# Patient Record
Sex: Female | Born: 1985 | Race: Black or African American | Hispanic: No | Marital: Single | Smoking: Never smoker
Health system: Southern US, Community
[De-identification: ages and names within clinical notes are randomized; demographics above are authoritative.]

## PROBLEM LIST (undated history)

## (undated) ENCOUNTER — Inpatient Hospital Stay (HOSPITAL_COMMUNITY): Payer: Self-pay

## (undated) DIAGNOSIS — Z973 Presence of spectacles and contact lenses: Secondary | ICD-10-CM

## (undated) DIAGNOSIS — Z87442 Personal history of urinary calculi: Secondary | ICD-10-CM

## (undated) DIAGNOSIS — R87629 Unspecified abnormal cytological findings in specimens from vagina: Secondary | ICD-10-CM

## (undated) DIAGNOSIS — D069 Carcinoma in situ of cervix, unspecified: Secondary | ICD-10-CM

## (undated) HISTORY — PX: COLPOSCOPY: SHX161

---
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
## 2003-03-16 ENCOUNTER — Encounter: Payer: Self-pay | Admitting: Emergency Medicine

## 2003-03-16 ENCOUNTER — Emergency Department (HOSPITAL_COMMUNITY): Admission: EM | Admit: 2003-03-16 | Discharge: 2003-03-16 | Payer: Self-pay | Admitting: Emergency Medicine

## 2003-05-28 ENCOUNTER — Emergency Department (HOSPITAL_COMMUNITY): Admission: EM | Admit: 2003-05-28 | Discharge: 2003-05-28 | Payer: Self-pay | Admitting: Emergency Medicine

## 2003-06-11 ENCOUNTER — Emergency Department (HOSPITAL_COMMUNITY): Admission: EM | Admit: 2003-06-11 | Discharge: 2003-06-11 | Payer: Self-pay | Admitting: Emergency Medicine

## 2003-11-11 ENCOUNTER — Emergency Department (HOSPITAL_COMMUNITY): Admission: EM | Admit: 2003-11-11 | Discharge: 2003-11-11 | Payer: Self-pay | Admitting: Emergency Medicine

## 2004-02-23 ENCOUNTER — Emergency Department (HOSPITAL_COMMUNITY): Admission: EM | Admit: 2004-02-23 | Discharge: 2004-02-24 | Payer: Self-pay | Admitting: Emergency Medicine

## 2004-02-27 ENCOUNTER — Emergency Department (HOSPITAL_COMMUNITY): Admission: EM | Admit: 2004-02-27 | Discharge: 2004-02-28 | Payer: Self-pay | Admitting: Emergency Medicine

## 2004-03-19 ENCOUNTER — Emergency Department (HOSPITAL_COMMUNITY): Admission: AD | Admit: 2004-03-19 | Discharge: 2004-03-19 | Payer: Self-pay | Admitting: Family Medicine

## 2004-05-28 ENCOUNTER — Emergency Department (HOSPITAL_COMMUNITY): Admission: EM | Admit: 2004-05-28 | Discharge: 2004-05-28 | Payer: Self-pay | Admitting: Emergency Medicine

## 2004-06-08 ENCOUNTER — Emergency Department (HOSPITAL_COMMUNITY): Admission: EM | Admit: 2004-06-08 | Discharge: 2004-06-08 | Payer: Self-pay | Admitting: Emergency Medicine

## 2004-07-11 ENCOUNTER — Emergency Department (HOSPITAL_COMMUNITY): Admission: EM | Admit: 2004-07-11 | Discharge: 2004-07-11 | Payer: Self-pay | Admitting: *Deleted

## 2004-08-01 ENCOUNTER — Emergency Department (HOSPITAL_COMMUNITY): Admission: EM | Admit: 2004-08-01 | Discharge: 2004-08-01 | Payer: Self-pay | Admitting: Family Medicine

## 2004-10-23 ENCOUNTER — Emergency Department (HOSPITAL_COMMUNITY): Admission: EM | Admit: 2004-10-23 | Discharge: 2004-10-23 | Payer: Self-pay | Admitting: *Deleted

## 2005-01-17 ENCOUNTER — Emergency Department (HOSPITAL_COMMUNITY): Admission: EM | Admit: 2005-01-17 | Discharge: 2005-01-17 | Payer: Self-pay | Admitting: Family Medicine

## 2005-01-28 ENCOUNTER — Emergency Department (HOSPITAL_COMMUNITY): Admission: EM | Admit: 2005-01-28 | Discharge: 2005-01-28 | Payer: Self-pay | Admitting: Family Medicine

## 2005-02-25 ENCOUNTER — Emergency Department (HOSPITAL_COMMUNITY): Admission: EM | Admit: 2005-02-25 | Discharge: 2005-02-25 | Payer: Self-pay | Admitting: Emergency Medicine

## 2005-04-01 ENCOUNTER — Emergency Department (HOSPITAL_COMMUNITY): Admission: EM | Admit: 2005-04-01 | Discharge: 2005-04-01 | Payer: Self-pay | Admitting: Emergency Medicine

## 2005-04-12 ENCOUNTER — Ambulatory Visit: Payer: Self-pay | Admitting: Internal Medicine

## 2005-04-14 ENCOUNTER — Inpatient Hospital Stay (HOSPITAL_COMMUNITY): Admission: EM | Admit: 2005-04-14 | Discharge: 2005-04-16 | Payer: Self-pay | Admitting: Emergency Medicine

## 2005-05-18 ENCOUNTER — Emergency Department (HOSPITAL_COMMUNITY): Admission: EM | Admit: 2005-05-18 | Discharge: 2005-05-18 | Payer: Self-pay | Admitting: Family Medicine

## 2005-12-25 ENCOUNTER — Emergency Department (HOSPITAL_COMMUNITY): Admission: EM | Admit: 2005-12-25 | Discharge: 2005-12-25 | Payer: Self-pay | Admitting: Family Medicine

## 2006-05-08 ENCOUNTER — Emergency Department (HOSPITAL_COMMUNITY): Admission: EM | Admit: 2006-05-08 | Discharge: 2006-05-09 | Payer: Self-pay | Admitting: Emergency Medicine

## 2006-05-19 ENCOUNTER — Emergency Department (HOSPITAL_COMMUNITY): Admission: EM | Admit: 2006-05-19 | Discharge: 2006-05-19 | Payer: Self-pay | Admitting: Emergency Medicine

## 2006-06-14 ENCOUNTER — Emergency Department (HOSPITAL_COMMUNITY): Admission: EM | Admit: 2006-06-14 | Discharge: 2006-06-14 | Payer: Self-pay | Admitting: Emergency Medicine

## 2006-06-30 ENCOUNTER — Emergency Department (HOSPITAL_COMMUNITY): Admission: EM | Admit: 2006-06-30 | Discharge: 2006-06-30 | Payer: Self-pay | Admitting: Emergency Medicine

## 2006-07-01 ENCOUNTER — Emergency Department (HOSPITAL_COMMUNITY): Admission: EM | Admit: 2006-07-01 | Discharge: 2006-07-02 | Payer: Self-pay | Admitting: Emergency Medicine

## 2006-08-16 ENCOUNTER — Emergency Department (HOSPITAL_COMMUNITY): Admission: EM | Admit: 2006-08-16 | Discharge: 2006-08-16 | Payer: Self-pay | Admitting: Emergency Medicine

## 2007-01-06 ENCOUNTER — Emergency Department (HOSPITAL_COMMUNITY): Admission: EM | Admit: 2007-01-06 | Discharge: 2007-01-06 | Payer: Self-pay | Admitting: Emergency Medicine

## 2007-03-27 ENCOUNTER — Emergency Department (HOSPITAL_COMMUNITY): Admission: EM | Admit: 2007-03-27 | Discharge: 2007-03-27 | Payer: Self-pay | Admitting: Emergency Medicine

## 2007-05-25 ENCOUNTER — Emergency Department (HOSPITAL_COMMUNITY): Admission: EM | Admit: 2007-05-25 | Discharge: 2007-05-25 | Payer: Self-pay | Admitting: Emergency Medicine

## 2007-06-24 ENCOUNTER — Emergency Department (HOSPITAL_COMMUNITY): Admission: EM | Admit: 2007-06-24 | Discharge: 2007-06-24 | Payer: Self-pay | Admitting: Emergency Medicine

## 2007-07-28 ENCOUNTER — Emergency Department (HOSPITAL_COMMUNITY): Admission: EM | Admit: 2007-07-28 | Discharge: 2007-07-28 | Payer: Self-pay | Admitting: Emergency Medicine

## 2007-08-03 ENCOUNTER — Emergency Department (HOSPITAL_COMMUNITY): Admission: EM | Admit: 2007-08-03 | Discharge: 2007-08-03 | Payer: Self-pay | Admitting: Emergency Medicine

## 2008-02-26 ENCOUNTER — Emergency Department (HOSPITAL_COMMUNITY): Admission: EM | Admit: 2008-02-26 | Discharge: 2008-02-26 | Payer: Self-pay | Admitting: Family Medicine

## 2008-08-31 ENCOUNTER — Emergency Department (HOSPITAL_COMMUNITY): Admission: EM | Admit: 2008-08-31 | Discharge: 2008-08-31 | Payer: Self-pay | Admitting: Family Medicine

## 2008-12-04 ENCOUNTER — Emergency Department (HOSPITAL_COMMUNITY): Admission: EM | Admit: 2008-12-04 | Discharge: 2008-12-04 | Payer: Self-pay | Admitting: Family Medicine

## 2009-02-12 ENCOUNTER — Emergency Department (HOSPITAL_COMMUNITY): Admission: EM | Admit: 2009-02-12 | Discharge: 2009-02-12 | Payer: Self-pay | Admitting: Emergency Medicine

## 2009-02-14 ENCOUNTER — Emergency Department (HOSPITAL_COMMUNITY): Admission: EM | Admit: 2009-02-14 | Discharge: 2009-02-14 | Payer: Self-pay | Admitting: Family Medicine

## 2010-03-13 ENCOUNTER — Emergency Department (HOSPITAL_COMMUNITY): Admission: EM | Admit: 2010-03-13 | Discharge: 2010-03-13 | Payer: Self-pay | Admitting: Emergency Medicine

## 2010-08-14 ENCOUNTER — Emergency Department (HOSPITAL_COMMUNITY): Admission: EM | Admit: 2010-08-14 | Discharge: 2010-08-14 | Payer: Self-pay | Admitting: Family Medicine

## 2010-11-09 ENCOUNTER — Emergency Department (HOSPITAL_COMMUNITY): Admission: EM | Admit: 2010-11-09 | Discharge: 2010-11-09 | Payer: Self-pay | Admitting: Emergency Medicine

## 2010-12-30 HISTORY — PX: WISDOM TOOTH EXTRACTION: SHX21

## 2011-03-12 LAB — URINALYSIS, ROUTINE W REFLEX MICROSCOPIC
Bilirubin Urine: NEGATIVE
Glucose, UA: NEGATIVE mg/dL
Hgb urine dipstick: NEGATIVE
Ketones, ur: NEGATIVE mg/dL
Nitrite: NEGATIVE
Protein, ur: NEGATIVE mg/dL
Specific Gravity, Urine: 1.024 (ref 1.005–1.030)
Urobilinogen, UA: 1 mg/dL (ref 0.0–1.0)
pH: 7 (ref 5.0–8.0)

## 2011-03-12 LAB — WET PREP, GENITAL
Clue Cells Wet Prep HPF POC: NONE SEEN
Trich, Wet Prep: NONE SEEN
Yeast Wet Prep HPF POC: NONE SEEN

## 2011-03-12 LAB — POCT PREGNANCY, URINE: Preg Test, Ur: NEGATIVE

## 2011-03-12 LAB — GC/CHLAMYDIA PROBE AMP, GENITAL
Chlamydia, DNA Probe: NEGATIVE
GC Probe Amp, Genital: NEGATIVE

## 2011-03-22 LAB — CBC
HCT: 36.4 % (ref 36.0–46.0)
Hemoglobin: 11.9 g/dL — ABNORMAL LOW (ref 12.0–15.0)
MCHC: 32.7 g/dL (ref 30.0–36.0)
MCV: 91 fL (ref 78.0–100.0)
Platelets: 283 10*3/uL (ref 150–400)
RBC: 4 MIL/uL (ref 3.87–5.11)
RDW: 13.2 % (ref 11.5–15.5)
WBC: 10.2 10*3/uL (ref 4.0–10.5)

## 2011-03-22 LAB — DIFFERENTIAL
Basophils Absolute: 0 10*3/uL (ref 0.0–0.1)
Basophils Relative: 0 % (ref 0–1)
Eosinophils Absolute: 0.2 10*3/uL (ref 0.0–0.7)
Eosinophils Relative: 2 % (ref 0–5)
Lymphocytes Relative: 32 % (ref 12–46)
Lymphs Abs: 3.3 10*3/uL (ref 0.7–4.0)
Monocytes Absolute: 0.8 10*3/uL (ref 0.1–1.0)
Monocytes Relative: 8 % (ref 3–12)
Neutro Abs: 5.9 10*3/uL (ref 1.7–7.7)
Neutrophils Relative %: 57 % (ref 43–77)

## 2011-03-22 LAB — POCT I-STAT, CHEM 8
BUN: 8 mg/dL (ref 6–23)
Calcium, Ion: 1.15 mmol/L (ref 1.12–1.32)
Chloride: 106 mEq/L (ref 96–112)
Creatinine, Ser: 0.6 mg/dL (ref 0.4–1.2)
Glucose, Bld: 100 mg/dL — ABNORMAL HIGH (ref 70–99)
HCT: 37 % (ref 36.0–46.0)
Hemoglobin: 12.6 g/dL (ref 12.0–15.0)
Potassium: 3.8 mEq/L (ref 3.5–5.1)
Sodium: 139 mEq/L (ref 135–145)
TCO2: 26 mmol/L (ref 0–100)

## 2011-03-22 LAB — POCT PREGNANCY, URINE: Preg Test, Ur: NEGATIVE

## 2011-04-16 LAB — URINE MICROSCOPIC-ADD ON

## 2011-04-16 LAB — URINALYSIS, ROUTINE W REFLEX MICROSCOPIC
Bilirubin Urine: NEGATIVE
Glucose, UA: NEGATIVE mg/dL
Ketones, ur: 15 mg/dL — AB
Nitrite: NEGATIVE
Protein, ur: NEGATIVE mg/dL
Specific Gravity, Urine: 1.03 (ref 1.005–1.030)
Urobilinogen, UA: 1 mg/dL (ref 0.0–1.0)
pH: 6.5 (ref 5.0–8.0)

## 2011-04-16 LAB — POCT URINALYSIS DIP (DEVICE)
Bilirubin Urine: NEGATIVE
Glucose, UA: NEGATIVE mg/dL
Ketones, ur: NEGATIVE mg/dL
Nitrite: NEGATIVE
Protein, ur: NEGATIVE mg/dL
Specific Gravity, Urine: 1.02 (ref 1.005–1.030)
Urobilinogen, UA: 2 mg/dL — ABNORMAL HIGH (ref 0.0–1.0)
pH: 7 (ref 5.0–8.0)

## 2011-04-16 LAB — URINE CULTURE: Colony Count: >=100000

## 2011-04-16 LAB — POCT PREGNANCY, URINE: Preg Test, Ur: NEGATIVE

## 2011-04-23 ENCOUNTER — Inpatient Hospital Stay (INDEPENDENT_AMBULATORY_CARE_PROVIDER_SITE_OTHER)
Admission: RE | Admit: 2011-04-23 | Discharge: 2011-04-23 | Disposition: A | Discharge status: Home / Self Care | Payer: Managed Care, Other (non HMO) | Source: Ambulatory Visit | Attending: Family Medicine | Admitting: Family Medicine

## 2011-04-23 DIAGNOSIS — R509 Fever, unspecified: Secondary | ICD-10-CM

## 2011-04-23 DIAGNOSIS — J020 Streptococcal pharyngitis: Secondary | ICD-10-CM

## 2011-04-23 LAB — POCT RAPID STREP A (OFFICE): Streptococcus, Group A Screen (Direct): POSITIVE — AB

## 2011-05-17 NOTE — Discharge Summary (Signed)
   NAME:  [name], [name]                        ACCOUNT NO.:  [id number]   MEDICAL RECORD NO.:  [id number]                   PATIENT TYPE:  INP   LOCATION:  9125                                 FACILITY:  [facility]   PHYSICIAN:  Roseanna Rainbow, M.D.         DATE OF BIRTH:  Jan 29, 1986   DATE OF ADMISSION:  12/23/2002  DATE OF DISCHARGE:  12/26/2002                                 DISCHARGE SUMMARY   CHIEF COMPLAINT:  The patient is an 25 year old G1 with an EDC of December  17 who presented with regular strong contractions.   HISTORY OF PRESENT ILLNESS:  She denied ruptured membranes.  Her prenatal  course was uncomplicated.   PAST MEDICAL HISTORY:  She denies.   PAST SURGICAL HISTORY:  She denies.   MEDICATIONS:  Prenatal vitamins.   ALLERGIES:  No known drug allergies.   SOCIAL HISTORY:  Single.  She denies any tobacco, ethanol, or substance  abuse.   PHYSICAL EXAMINATION:  VITAL SIGNS:  Stable, afebrile.  ABDOMEN:  Soft, gravid, nontender.  PELVIC:  Cervix 6 cm dilated, 100% effaced with vertex at a -1 station.  During the examination artificial rupture of membranes was performed with  clear fluid.   LABORATORIES:  Hemoglobin 10.   ASSESSMENT:  Intrauterine pregnancy at 40+ weeks, active labor.   PLAN:  Admission with expectant management.   HOSPITAL COURSE:  The patient was subsequently admitted and augmented with  Pitocin for a protracted active phase of labor.  She progressed to fully  dilated with persistent occiput posterior and arrest of descent at a +1 to  +2 station.  At this point she was brought for a cesarean delivery.  Please  see the dictated operative summary for further details.  Postoperative  course was uneventful and she was discharged to home on postoperative day  number three tolerating a regular diet.   DISCHARGE DIAGNOSES:  1. Term intrauterine pregnancy.  2. Arrest of descent.  3. Persistent occiput posterior.   PROCEDURE:  Primary  cesarean delivery.   CONDITION ON DISCHARGE:  Good.   DIET:  Regular.   ACTIVITY:  No strenuous activity.  No intercourse.   MEDICATIONS:  1. Percocet one to two tablets q.4-6h. as needed.  2. Micronor one tablet p.o. daily second Sunday start.   DISPOSITION:  The patient was to have follow-up in the office for her six-  week postpartum visit.                                               Roseanna Rainbow, M.D.    [name]  D:  02/10/2003  T:  02/10/2003  Job:  [id number]

## 2011-05-17 NOTE — Discharge Summary (Signed)
[name], [name]              ACCOUNT NO.:  [id number]   MEDICAL RECORD NO.:  [id number]          PATIENT TYPE:  INP   LOCATION:  0362                         FACILITY:  [facility]   PHYSICIAN:  Gertha Calkin, M.D.DATE OF BIRTH:  [birth year]   DATE OF ADMISSION:  04/14/2005  DATE OF DISCHARGE:  04/16/2005                                 DISCHARGE SUMMARY   PRIMARY CARE PHYSICIAN:  Unassigned.   CONSULTS:  Dr. Juanda Chance, GI.   DISCHARGE DIAGNOSES:  1.  Gastritis secondary to retching episodes.  2.  Gastroenteritis.   DISCHARGE MEDICATIONS:  Protonix 40 mg p.o. daily.   SPECIAL INSTRUCTIONS:  Avoid aspirin-related products for at least a month.   HOSPITAL COURSE:  Please see H&P for details of admission.   Problem 1:  Gastritis:  The patient came in with an episode of hematemesis  after a severe retching episode.  The patient was seen by GI on admission  and had an EGD done.  Please see GI consultation and EGD report for details.  Essentially, the patient had some erosions noted in the cardia and fundus of  the stomach.  The patient had no other active bleeding noted during the EGD.  The patient resolved her symptoms of nausea and vomiting with no diarrhea  during her hospitalization.  She is now tolerating p.o. diet.  No epigastric  pain or discomfort on the day of discharge.   Problem 2:  Gastroenteritis:  This was supported during the hospitalization.  The patient is being discharged home in stable condition without any nausea,  vomiting, or diarrhea.   DISCHARGE LABS:  Sodium 138, potassium 3.4, chloride 107, bicarb 28, glucose  88, BUN 4, creatinine 0.7, calcium 8.2.  C. diff toxin was negative.  CBC  showed hemoglobin 10.7, white count 7.8, platelets 285.  Stool leukocytes  were negative.   Patient was initially started on Cipro IV for empiric treatment, possible  Salmonella infection, and will continue empiric treatment for a full course.     JD/MEDQ  D:   04/16/2005  T:  04/16/2005  Job:  [id number]   cc:   Lina Sar, M.D. [facility]

## 2011-05-17 NOTE — Discharge Summary (Signed)
NAMEGEORJEAN, [name]                        ACCOUNT NO.:  [id number]   MEDICAL RECORD NO.:  [id number]                   PATIENT TYPE:  INP   LOCATION:  9141                                 [facility]ACILITY[facility]  [facility]   PHYSICIAN:  Charles A. Clearance Coots, M.D.             DATE OF BIRTH:  1986-06-30   DATE OF ADMISSION:  10/24/2002  DATE OF DISCHARGE:  10/28/2002                                 DISCHARGE SUMMARY   ADMITTING DIAGNOSES:  1. A 32 week intrauterine pregnancy.  2. Pyelonephritis.   DISCHARGE DIAGNOSES:  1. A 32 week intrauterine pregnancy.  2. Pyelonephritis improved after intravenous antibiotic therapy.   DISPOSITION:  Discharged home in good condition.   REASON FOR ADMISSION:  An 25 year old black female G1, estimated date of  confinement of December 15, 2002 admitted to [facility] at [redacted] weeks  gestation with a complaint of two days of left flank pain and fever and one  day of severe diffuse muscle aches.  The patient is status post treatment  for a urinary tract infection for one week.  She had distant history of  uronephrolithiasis, recent exposure to sick child.  Denies upper respiratory  tract symptoms, nausea, vomiting, diarrhea, or uterine contractions.  She  had good fetal movement.  Prenatal care was initially done at [facility]  at [facility] and she was transferred to the [facility], care of Dr. Antionette Char.  Her onset of prenatal care  was at [redacted] weeks gestation.   PAST SURGICAL HISTORY:  None.   PAST MEDICAL HISTORY:  Illnesses:  Heart murmur.   MEDICATIONS:  Iron, prenatal vitamins.   ALLERGIES:  No known drug allergies.   SOCIAL HISTORY:  Negative for tobacco, alcohol, or recreational drugs.   GYN HISTORY:  Negative for sexually transmitted diseases.   PHYSICAL EXAMINATION:  GENERAL:  Well-nourished, well-developed black female  in no acute distress.  VITAL SIGNS:  She was afebrile and vital signs were  stable.  Fetal heart  tones revealed baseline fetal tachycardia which settled down to 140-150  beats per minute after hydration.  There were no fetal heart rate  decelerations and the fetal heart rate tracing was reassuring after  hydration.  ABDOMEN:  Soft, nontender.  PELVIC:  Cervix is long, closed.  Presenting part was high.   IMPRESSION:  1. Intrauterine pregnancy at [redacted] weeks gestation.  2. Probable pyelonephritis.   PLAN:  Admit for IV fluid hydration and continuation of antibiotic therapy  intravenously to cover pyelonephritis.   ADMITTING LABORATORY VALUES:  Hemoglobin 10.0, hematocrit 30.2, white blood  cell count 15,200, platelets 254,000.  Electrolytes were within normal  limits.  Comprehensive metabolic panel was within normal limits.  Urinalysis  revealed moderate leukocyte esterase, trace hemoglobin, few bacteria, 7-10  white blood cells, 0-2 red blood cells, greater than 80 ketones, pH 7.0,  specific gravity 1.020.   HOSPITAL COURSE:  The  patient was admitted and started on IV Cefotan.  Responded well to IV therapy and by hospital day number two was feeling much  better.  Discharged home much improved on hospital day number three.   DISCHARGE MEDICATIONS:  1. Continue p.o. antibiotics with Ceftin 500 mg b.i.d. for seven days to     complete a total of 10 days antibiotic therapy for pyelonephritis.  2. Continue prenatal vitamins and iron.   DISCHARGE DISPOSITION:  Routine obstetrical written instructions were given.  The patient is to follow up regular appointment with Dr. Tamela Oddi at  the [facility].                                               Charles A. Clearance Coots, M.D.    CAH/MEDQ  D:  10/28/2002  T:  10/28/2002  Job:  [id number]   cc:   Roseanna Rainbow, M.D.  [address]  [place]  [place] [address]  Fax: [phone]

## 2011-05-17 NOTE — H&P (Signed)
NAMEKRISTE, [name]              ACCOUNT NO.:  [id number]   MEDICAL RECORD NO.:  [id number]          PATIENT TYPE:  INP   LOCATION:  0102                         FACILITY:  [facility]   PHYSICIAN:  Danae Chen, M.D.DATE OF BIRTH:  21-Apr-1986   DATE OF ADMISSION:  04/14/2005  DATE OF DISCHARGE:                                HISTORY & PHYSICAL   The patient is an unassigned patient with no primary care physician.   CHIEF COMPLAINT:  Nausea, vomiting, diarrhea with hematemesis.   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  She started about 4 p.m. this afternoon and has  continued on until she got to emergency room.  This was witnessed in the  emergency room as well.  She had some pinkish, clear emesis, about 500-600  mL.  She reports that she has never had any bloody vomitus in the past.  She  does not use any blood thinners, no aspirin, no nonsteroidals, over-the-  counter ibuprofen, or other medications.  She does report that two days ago  that she had eaten some boiled eggs from an [place] egg hunt as well as some  cole slaw and potato salad that was left unrefrigerated.  She has had  chills, subjective fever as well.  No chest pain, no shortness of breath, no  numbness or tingling in her hands or feet.   PAST MEDICAL HISTORY:  Not significant.   PAST SURGICAL HISTORY:  Cesarean section.   FAMILY HISTORY:  She is unaware of any significant family medical problems.   SOCIAL HISTORY:  She does not drink.  She does not smoke.  She is enrolled  at [employer]. She has one child.  She has had a cesarean section.  No  other pregnancies and she lives with her child.   REVIEW OF SYSTEMS:  Per the HPI.   PHYSICAL EXAMINATION:  VITAL SIGNS:  Temperature 97.6, pulse 77,  respirations 20, blood pressure 115/83.  O2 saturation is 96% on room air.  GENERAL:  She is in some mild distress, but speaking complete sentences,  answers questions appropriately.  Sister is at bedside.  HEENT:  Pupils are equal and reactive.  She has no scleral pallor.  Mucous  membranes are moist.  Oropharynx is clear.  NECK:  Supple.  There is no  lymphadenopathy.  LUNGS:  Clear to auscultation bilaterally.  HEART:  Rate is regular.  No murmurs are appreciated.  ABDOMEN:  She has no right upper quadrant tenderness.  No periumbilical  tenderness.  She has some hypoactive bowel sounds.  No suprapubic  tenderness.  No costovertebral angle tenderness.  EXTREMITIES:  2+ femoral pulses, 2+ dorsalis pedis pulses.  Extremities are  warm.  She has no peripheral edema.  NEUROLOGIC:  No focal deficits that are noticed.   LABORATORY DATA:  Acute abdominal series showing no acute abnormalities.  Pertinent labs:  White count 21,000 with an absolute  neutrophil count of  19.6, hemoglobin 13.9.  Urine pregnancy test is negative.  Platelets 307,  potassium 3.6, sodium 136, glucose 129, BUN 9, creatinine 0.8.  Her liver  function tests are within normal limits with an AST of 25, ALT 19, alk phos  72, total bilirubin 0.7, albumin 4.3.   MEDICATIONS:  As noted, she is not on any medications.   ALLERGIES:  No drug allergies.   ASSESSMENT:  A 25 year old African American female with hematemesis, nausea  and vomiting, subjective chills and fever.  She also relates that two weeks  ago she was seen by Urgent Care physician and was diagnosed with a kidney  infection and was placed on Cipro twice a day for 14 days.  She took about  five days worth but was not able afford the rest of her medicine so went  about six days without taking any medicines and then two days ago resumed  twice a day Cipro.  She does not know exactly whether resumption of the  antibiotics correlate with her symptoms.  She thinks it  is more related to  when she ate the eggs.   At this time, given the fact that she is hemodynamically stable, with a  hemoglobin in at normal level, we will admit her for observation.  Keep her  NPO after midline for possible endoscopy in the morning.  We will place two  large bore IV's, type and cross two units packed red blood cells and monitor  her clinically.  There is a chance that she may have a Salmonella infection.  We will check a stool, white blood cell, fecal leukocyte count as well as a  Clostridium difficile toxin given that she has been on recent antibiotics.  We will treat empirically with IV Cipro until that time to cover for the  Salmonella infection and await the further lab results.  It may be that she  has suffered a small Mallory-Weiss tear from her emesis and so, therefore,  GI has been consulted and are aware of her situation and will follow up with  her in the morning or sooner if needed.      RLK/MEDQ  D:  04/15/2005  T:  04/15/2005  Job:  [id number]

## 2011-05-17 NOTE — Op Note (Signed)
[name], [name]                        ACCOUNT NO.:  [id number]   MEDICAL RECORD NO.:  [id number]                   PATIENT TYPE:  INP   LOCATION:  9125                                 FACILITY:  [facility]   PHYSICIAN:  Roseanna Rainbow, M.D.         DATE OF BIRTH:  1986-06-20   DATE OF PROCEDURE:  12/23/2002  DATE OF DISCHARGE:  12/26/2002                                 OPERATIVE REPORT   PREOPERATIVE DIAGNOSES:  1. Term intrauterine pregnancy.  2. Arrest of descent.  3. Persistent occiput posterior.   POSTOPERATIVE DIAGNOSES:  1. Term intrauterine pregnancy.  2. Arrest of descent.  3. Persistent occiput posterior.   PROCEDURE:  Primary low transverse cesarean section via Pfannenstiel.   SURGEON:  Roseanna Rainbow, M.D.   ANESTHESIA:  Epidural.   COMPLICATIONS:  None.   ESTIMATED BLOOD LOSS:  800 cc.   FLUIDS:  As per anesthesiology.   URINE OUTPUT:  As per anesthesiology.   INDICATIONS:  The patient is an 25 year old G1, P0 at term who presented in  latent labor.  Maximum dilatation fully dilated without descent beyond a  +1/+2 station.  The position of the fetal head was left occiput posterior.   FINDINGS:  Female infant in cephalic presentation.  Neonatology present at  delivery.  Weight 8 pounds 12 ounces.  Normal uterus.   PROCEDURE:  The patient was taken to the operating room where her epidural  anesthetic was found to be adequate.  She was then prepped and draped in the  usual sterile fashion in the dorsal supine position with a leftward tilt.  Pfannenstiel skin incision was made with a scalpel and carried through to  the underlying layer of fascia with the Bovie.  The fascia was then incised  in the midline and the incision extended laterally with the Mayo scissors.  The superior aspect of the fascial incision was then grasped with Kocher  clamps, elevated, and the underlying rectus muscles dissected off.  Attention was then turned to the  inferior aspect of the incision which was  manipulated in a similar fashion.  The rectus muscles were separated in the  midline and the parietoperitoneum tented up and entered sharply.  The  peritoneal incision was then extended superiorly and inferiorly with good  visualization of the bladder.  The bladder blade was then inserted and the  vesicouterine peritoneum identified, grasped with the pickups, and entered  sharply with the Metzenbaum scissors.  This incision was then extended  laterally and the bladder flap created digitally.  The bladder blade was  then reinserted and the lower uterine segment incised in a transverse  fashion with the scalpel.  The uterine incision was then extended laterally  with bandage scissors.  The bladder blade was then removed and the infant's  head delivered atraumatically.  The nose and mouth were suctioned with the  bulb suction and the cord was clamped and cut.  The infant  was handed off to  the awaiting neonatologists.  The placenta was then removed, the uterus  cleared of all clots and debris.  The uterine incision was then repaired  with 0 Monocryl in a running locked fashion.  Second layer of the same was  used to imbricate the middle aspect of the incision.  Excellent hemostasis  was noted.  The gutters were cleared of all clots.  The fascia was  reapproximated with 0 Vicryl in a running fashion.  The skin was closed with  staples.  The patient tolerated the procedure well.  Sponge, lap, and needle  counts were correct x2.  1 g of cefazolin was given at cord clamp.  The  patient is taken to the PACU in stable condition.                                               Roseanna Rainbow, M.D.    Judee Clara  D:  12/27/2002  T:  12/27/2002  Job:  [id number]

## 2011-06-26 ENCOUNTER — Inpatient Hospital Stay (INDEPENDENT_AMBULATORY_CARE_PROVIDER_SITE_OTHER)
Admission: RE | Admit: 2011-06-26 | Discharge: 2011-06-26 | Disposition: A | Discharge status: Home / Self Care | Payer: Managed Care, Other (non HMO) | Source: Ambulatory Visit | Attending: Family Medicine | Admitting: Family Medicine

## 2011-06-26 DIAGNOSIS — A499 Bacterial infection, unspecified: Secondary | ICD-10-CM

## 2011-06-26 DIAGNOSIS — N760 Acute vaginitis: Secondary | ICD-10-CM

## 2011-06-26 DIAGNOSIS — J020 Streptococcal pharyngitis: Secondary | ICD-10-CM

## 2011-06-26 LAB — POCT URINALYSIS DIP (DEVICE)
Glucose, UA: NEGATIVE mg/dL
Ketones, ur: NEGATIVE mg/dL
Leukocytes, UA: NEGATIVE
Nitrite: NEGATIVE
Protein, ur: NEGATIVE mg/dL
Specific Gravity, Urine: 1.025 (ref 1.005–1.030)
Urobilinogen, UA: 2 mg/dL — ABNORMAL HIGH (ref 0.0–1.0)
pH: 6 (ref 5.0–8.0)

## 2011-06-26 LAB — WET PREP, GENITAL
Trich, Wet Prep: NONE SEEN
Yeast Wet Prep HPF POC: NONE SEEN

## 2011-06-26 LAB — POCT PREGNANCY, URINE: Preg Test, Ur: NEGATIVE

## 2011-06-26 LAB — POCT RAPID STREP A: Streptococcus, Group A Screen (Direct): POSITIVE — AB

## 2011-06-27 LAB — GC/CHLAMYDIA PROBE AMP, GENITAL
Chlamydia, DNA Probe: NEGATIVE
GC Probe Amp, Genital: NEGATIVE

## 2011-07-07 ENCOUNTER — Emergency Department (HOSPITAL_COMMUNITY)
Admission: EM | Admit: 2011-07-07 | Discharge: 2011-07-07 | Disposition: A | Discharge status: Home / Self Care | Payer: Managed Care, Other (non HMO) | Attending: Emergency Medicine | Admitting: Emergency Medicine

## 2011-07-07 DIAGNOSIS — R05 Cough: Secondary | ICD-10-CM | POA: Insufficient documentation

## 2011-07-07 DIAGNOSIS — R059 Cough, unspecified: Secondary | ICD-10-CM | POA: Insufficient documentation

## 2011-07-07 DIAGNOSIS — J40 Bronchitis, not specified as acute or chronic: Secondary | ICD-10-CM | POA: Insufficient documentation

## 2011-07-07 LAB — RAPID STREP SCREEN (MED CTR MEBANE ONLY): Streptococcus, Group A Screen (Direct): NEGATIVE

## 2011-09-20 LAB — POCT PREGNANCY, URINE
Operator id: 126491
Preg Test, Ur: NEGATIVE

## 2011-09-20 LAB — WET PREP, GENITAL
Trich, Wet Prep: NONE SEEN
Yeast Wet Prep HPF POC: NONE SEEN

## 2011-09-20 LAB — POCT URINALYSIS DIP (DEVICE)
Bilirubin Urine: NEGATIVE
Glucose, UA: NEGATIVE
Ketones, ur: NEGATIVE
Nitrite: NEGATIVE
Operator id: 126491
Protein, ur: 30 — AB
Specific Gravity, Urine: 1.02
Urobilinogen, UA: 1
pH: 7

## 2011-09-20 LAB — GC/CHLAMYDIA PROBE AMP, GENITAL
Chlamydia, DNA Probe: NEGATIVE
GC Probe Amp, Genital: NEGATIVE

## 2011-10-03 LAB — POCT URINALYSIS DIP (DEVICE)
Bilirubin Urine: NEGATIVE
Glucose, UA: NEGATIVE mg/dL
Nitrite: NEGATIVE
Protein, ur: 30 mg/dL — AB
Specific Gravity, Urine: 1.025 (ref 1.005–1.030)
Urobilinogen, UA: 2 mg/dL — ABNORMAL HIGH (ref 0.0–1.0)
pH: 6.5 (ref 5.0–8.0)

## 2011-10-03 LAB — POCT PREGNANCY, URINE: Preg Test, Ur: NEGATIVE

## 2011-10-03 LAB — WET PREP, GENITAL
Trich, Wet Prep: NONE SEEN
Yeast Wet Prep HPF POC: NONE SEEN

## 2011-10-03 LAB — GC/CHLAMYDIA PROBE AMP, GENITAL
Chlamydia, DNA Probe: NEGATIVE
GC Probe Amp, Genital: NEGATIVE

## 2011-10-16 LAB — POCT PREGNANCY, URINE
Operator id: 23701
Preg Test, Ur: NEGATIVE

## 2011-11-12 ENCOUNTER — Inpatient Hospital Stay (HOSPITAL_COMMUNITY): Payer: Managed Care, Other (non HMO)

## 2011-11-12 ENCOUNTER — Encounter (HOSPITAL_COMMUNITY): Payer: Self-pay

## 2011-11-12 ENCOUNTER — Inpatient Hospital Stay (HOSPITAL_COMMUNITY)
Admission: AD | Admit: 2011-11-12 | Discharge: 2011-11-12 | Disposition: A | Discharge status: Home / Self Care | Payer: Managed Care, Other (non HMO) | Source: Ambulatory Visit | Attending: Obstetrics & Gynecology | Admitting: Obstetrics & Gynecology

## 2011-11-12 DIAGNOSIS — O0289 Other abnormal products of conception: Secondary | ICD-10-CM | POA: Insufficient documentation

## 2011-11-12 DIAGNOSIS — O020 Blighted ovum and nonhydatidiform mole: Secondary | ICD-10-CM

## 2011-11-12 DIAGNOSIS — R109 Unspecified abdominal pain: Secondary | ICD-10-CM | POA: Insufficient documentation

## 2011-11-12 LAB — WET PREP, GENITAL
Clue Cells Wet Prep HPF POC: NONE SEEN
Trich, Wet Prep: NONE SEEN
Yeast Wet Prep HPF POC: NONE SEEN

## 2011-11-12 LAB — CBC
HCT: 37.2 % (ref 36.0–46.0)
Hemoglobin: 12.1 g/dL (ref 12.0–15.0)
MCH: 29.7 pg (ref 26.0–34.0)
MCHC: 32.5 g/dL (ref 30.0–36.0)
MCV: 91.2 fL (ref 78.0–100.0)
Platelets: 335 10*3/uL (ref 150–400)
RBC: 4.08 MIL/uL (ref 3.87–5.11)
RDW: 12.9 % (ref 11.5–15.5)
WBC: 13.4 10*3/uL — ABNORMAL HIGH (ref 4.0–10.5)

## 2011-11-12 LAB — URINALYSIS, ROUTINE W REFLEX MICROSCOPIC
Bilirubin Urine: NEGATIVE
Glucose, UA: NEGATIVE mg/dL
Ketones, ur: NEGATIVE mg/dL
Nitrite: NEGATIVE
Protein, ur: NEGATIVE mg/dL
Specific Gravity, Urine: 1.015 (ref 1.005–1.030)
Urobilinogen, UA: 1 mg/dL (ref 0.0–1.0)
pH: 6.5 (ref 5.0–8.0)

## 2011-11-12 LAB — URINE MICROSCOPIC-ADD ON

## 2011-11-12 LAB — ABO/RH: ABO/RH(D): O POS

## 2011-11-12 LAB — HCG, QUANTITATIVE, PREGNANCY: hCG, Beta Chain, Quant, S: 14304 m[IU]/mL — ABNORMAL HIGH (ref <5)

## 2011-11-12 NOTE — Progress Notes (Signed)
Patient states she started having lower abdominal pain yesterday. Denies any bleeding or vaginal discharge.

## 2011-11-12 NOTE — ED Provider Notes (Signed)
Orie Cuttino 52 y.o.G1P0 @[redacted]w[redacted]d  by sure LMP Chief Complaint  Patient presents with  . Abdominal Pain    SUBJECTIVE  HPI: Had pregnancy confirmation and labs at [facility] and due for NOB visit tomorrow. Reports 1 day history of sharp suprapubic abdominal pain that waxes and wanes but is constant. She first noticed during urination last night but is not continuing to have dysuria and has no urgency, frequency, hematuria. Pain is not exacerbated by movement or walking. No vaginal bleeding.  ROS: Pertinent items in HPI  OBJECTIVE  BP 102/69  Pulse 62  Temp(Src) 99.2 F (37.3 C) (Oral)  Resp 16  Ht 5' 5.5" (1.664 m)  Wt 93.895 kg (207 lb)  BMI 33.92 kg/m2  SpO2 99%   Physical Exam  Constitutional: She is oriented to person, place, and time and well-developed, well-nourished, and in no distress. No distress.  HENT:  Head: Normocephalic.  Neck: Normal range of motion.  Cardiovascular: Normal rate.   Pulmonary/Chest: Effort normal.  Abdominal: Soft. She exhibits no distension and no mass. There is no tenderness. There is no rebound and no guarding.  Genitourinary: Vagina normal and cervix normal. No vaginal discharge found.       Uterus 6-8 weeks size  Musculoskeletal: Normal range of motion.  Neurological: She is alert and oriented to person, place, and time.  Skin: Skin is warm and dry.  Psychiatric: Mood, affect and judgment normal.   Results for orders placed during the hospital encounter of 11/12/11 (from the past 24 hour(s))  URINALYSIS, ROUTINE W REFLEX MICROSCOPIC     Status: Abnormal   Collection Time   11/12/11 12:05 PM      Component Value Range   Color, Urine YELLOW  YELLOW    Appearance CLEAR  CLEAR    Specific Gravity, Urine 1.015  1.005 - 1.030    pH 6.5  5.0 - 8.0    Glucose, UA NEGATIVE  NEGATIVE (mg/dL)   Hgb urine dipstick SMALL (*) NEGATIVE    Bilirubin Urine NEGATIVE  NEGATIVE    Ketones, ur NEGATIVE  NEGATIVE (mg/dL)   Protein, ur NEGATIVE  NEGATIVE  (mg/dL)   Urobilinogen, UA 1.0  0.0 - 1.0 (mg/dL)   Nitrite NEGATIVE  NEGATIVE    Leukocytes, UA SMALL (*) NEGATIVE   URINE MICROSCOPIC-ADD ON     Status: Abnormal   Collection Time   11/12/11 12:05 PM      Component Value Range   Squamous Epithelial / LPF MANY (*) RARE    WBC, UA 3-6  <3 (WBC/hpf)   RBC / HPF 3-6  <3 (RBC/hpf)   Bacteria, UA RARE  RARE   WET PREP, GENITAL     Status: Abnormal   Collection Time   11/12/11 12:43 PM      Component Value Range   Yeast, Wet Prep NONE SEEN  NONE SEEN    Trich, Wet Prep NONE SEEN  NONE SEEN    Clue Cells, Wet Prep NONE SEEN  NONE SEEN    WBC, Wet Prep HPF POC MANY (*) NONE SEEN   ABO/RH     Status: Normal   Collection Time   11/12/11 12:50 PM      Component Value Range   ABO/RH(D) O POS    CBC     Status: Abnormal   Collection Time   11/12/11 12:51 PM      Component Value Range   WBC 13.4 (*) 4.0 - 10.5 (K/uL)   RBC 4.08  3.87 -  5.11 (MIL/uL)   Hemoglobin 12.1  12.0 - 15.0 (g/dL)   HCT 11.9  14.7 - 82.9 (%)   MCV 91.2  78.0 - 100.0 (fL)   MCH 29.7  26.0 - 34.0 (pg)   MCHC 32.5  30.0 - 36.0 (g/dL)   RDW 56.2  13.0 - 86.5 (%)   Platelets 335  150 - 400 (K/uL)  HCG, QUANTITATIVE, PREGNANCY     Status: Abnormal   Collection Time   11/12/11 12:51 PM      Component Value Range   hCG, Beta Chain, Quant, S 14304 (*) <5 (mIU/mL)   US Ob Comp Less 14 Wks  11/12/2011  OBSTETRICAL ULTRASOUND: This exam was performed within a [facility]. The OB US report was generated in the AS system, and faxed to the ordering physician.   This report is also available in [employer] and in the [employer]. See AS Obstetric US report.   US Ob Transvaginal  11/12/2011  OBSTETRICAL ULTRASOUND: This exam was performed within a [facility]. The OB US report was generated in the AS system, and faxed to the ordering physician.   This report is also available in [employer] and in the [employer]. See AS Obstetric US report.  [place]: irreg GS c/w [redacted]w[redacted]d size, no YS, no embryo, no FF, rt CLC ASSESSMENT  Blighted ovum   PLAN Discussed with Dr. Shawnie Pons. Explained high likelihood of failed pregnancy to patient and her mother. Offered options of Cytotec, repeating beta hCG in 2 days to confirm failed IUP, expectant management. Questions answered and and they elect to return in 2 days for a repeat quantitative beta hCG and if that is dropping, will proceed with Cytotec. Return for severe pain or heavy bleeding.

## 2011-11-12 NOTE — Progress Notes (Signed)
Pt c/o sharp intermittent lower abdominal pain since last night. No vaginal bleeding or discharge.

## 2011-11-13 LAB — URINE CULTURE
Colony Count: NO GROWTH
Culture  Setup Time: 201211140045
Culture: NO GROWTH
Special Requests: NORMAL

## 2011-11-13 LAB — GC/CHLAMYDIA PROBE AMP, GENITAL
Chlamydia, DNA Probe: NEGATIVE
GC Probe Amp, Genital: NEGATIVE

## 2011-11-18 ENCOUNTER — Inpatient Hospital Stay (HOSPITAL_COMMUNITY): Payer: Managed Care, Other (non HMO)

## 2011-11-18 ENCOUNTER — Inpatient Hospital Stay (HOSPITAL_COMMUNITY)
Admission: AD | Admit: 2011-11-18 | Discharge: 2011-11-18 | Disposition: A | Discharge status: Home / Self Care | Payer: Managed Care, Other (non HMO) | Source: Ambulatory Visit | Attending: Obstetrics & Gynecology | Admitting: Obstetrics & Gynecology

## 2011-11-18 ENCOUNTER — Encounter (HOSPITAL_COMMUNITY): Payer: Self-pay | Admitting: *Deleted

## 2011-11-18 DIAGNOSIS — O039 Complete or unspecified spontaneous abortion without complication: Secondary | ICD-10-CM | POA: Insufficient documentation

## 2011-11-18 DIAGNOSIS — O4690 Antepartum hemorrhage, unspecified, unspecified trimester: Secondary | ICD-10-CM

## 2011-11-18 LAB — CBC
HCT: 35.4 % — ABNORMAL LOW (ref 36.0–46.0)
Hemoglobin: 11.9 g/dL — ABNORMAL LOW (ref 12.0–15.0)
MCH: 30.4 pg (ref 26.0–34.0)
MCHC: 33.6 g/dL (ref 30.0–36.0)
MCV: 90.3 fL (ref 78.0–100.0)
Platelets: 326 10*3/uL (ref 150–400)
RBC: 3.92 MIL/uL (ref 3.87–5.11)
RDW: 13 % (ref 11.5–15.5)
WBC: 12.7 10*3/uL — ABNORMAL HIGH (ref 4.0–10.5)

## 2011-11-18 LAB — DIFFERENTIAL
Basophils Absolute: 0 10*3/uL (ref 0.0–0.1)
Basophils Relative: 0 % (ref 0–1)
Eosinophils Absolute: 0.2 10*3/uL (ref 0.0–0.7)
Eosinophils Relative: 2 % (ref 0–5)
Lymphocytes Relative: 22 % (ref 12–46)
Lymphs Abs: 2.8 10*3/uL (ref 0.7–4.0)
Monocytes Absolute: 0.7 10*3/uL (ref 0.1–1.0)
Monocytes Relative: 6 % (ref 3–12)
Neutro Abs: 9 10*3/uL — ABNORMAL HIGH (ref 1.7–7.7)
Neutrophils Relative %: 71 % (ref 43–77)

## 2011-11-18 LAB — HCG, QUANTITATIVE, PREGNANCY: hCG, Beta Chain, Quant, S: 7478 m[IU]/mL — ABNORMAL HIGH (ref <5)

## 2011-11-18 LAB — ABO/RH: ABO/RH(D): O POS

## 2011-11-18 MED ORDER — OXYCODONE-ACETAMINOPHEN 5-325 MG PO TABS
2.0000 | ORAL_TABLET | Freq: Once | ORAL | Status: AC
  Administered 2011-11-18: 2 via ORAL
  Filled 2011-11-18: qty 2

## 2011-11-18 NOTE — Progress Notes (Signed)
Here last wk, cramping no bleeding.  Went to see her doctor the following day, has appt in office tomorrow.  This morning 0200- noted blood when used restroom- spotting.  About 1700 saw more blood, was not a lot. Then became heavier, passed a couple clots, golf ball sized.

## 2011-11-18 NOTE — ED Provider Notes (Signed)
History     CSN: [id number] Arrival date & time: 11/18/2011  6:14 PM   None     Chief Complaint  Patient presents with  . Vaginal Bleeding  . Abdominal Pain    HPI [name] is a 25 y.o. female @ [redacted]w[redacted]d gestation who presents to [facility] for vaginal bleeding and abdominal pain. She was evaluated here 11/13 and had an ultrasound that showed a 7 wk 3 day IUGS but no FH. Patient was instructed to return in 2 days to repeat the Bhcg. Has not returned until today when bleeding and cramping started.  Past Medical History  Diagnosis Date  . Urinary tract infection   . Kidney stone   . Abnormal Pap smear     during preg  . Chlamydia     Past Surgical History  Procedure Date  . Cesarean section     Family History  Problem Relation Age of Onset  . Anesthesia problems Neg Hx     History  Substance Use Topics  . Smoking status: Never Smoker   . Smokeless tobacco: Never Used  . Alcohol Use: No    OB History    Grav Para Term Preterm Abortions TAB SAB Ect Mult Living   2 1 1       1       Review of Systems  Constitutional: Negative for fever and chills.  HENT: Negative.   Cardiovascular: Negative.   Gastrointestinal: Positive for abdominal pain.  Genitourinary: Positive for vaginal bleeding and pelvic pain. Negative for dysuria.  Musculoskeletal: Positive for back pain.  Skin: Negative.   Neurological: Negative for dizziness and headaches.  Psychiatric/Behavioral: Negative for confusion and agitation.    Allergies  Review of patient's allergies indicates no known allergies.  Home Medications  No current outpatient prescriptions on file.  BP 107/64  Pulse 79  Temp(Src) 99 F (37.2 C) (Oral)  Resp 20  Ht 5' 4.5" (1.638 m)  Wt 205 lb 6.4 oz (93.169 kg)  BMI 34.71 kg/m2  SpO2 98%  Physical Exam  Nursing note and vitals reviewed. Constitutional: She is oriented to person, place, and time. She appears well-developed and well-nourished.  HENT:  Head:  Normocephalic.  Eyes: EOM are normal.  Neck: Neck supple.  Cardiovascular: Normal rate.   Pulmonary/Chest: Effort normal.  Abdominal: Soft.  Musculoskeletal: Normal range of motion.  Neurological: She is alert and oriented to person, place, and time. No cranial nerve deficit.  Skin: Skin is warm and dry.  Psychiatric: She has a normal mood and affect. Her behavior is normal. Judgment and thought content normal.   Results for orders placed during the hospital encounter of 11/18/11 (from the past 24 hour(s))  CBC     Status: Abnormal   Collection Time   11/18/11  7:33 PM      Component Value Range   WBC 12.7 (*) 4.0 - 10.5 (K/uL)   RBC 3.92  3.87 - 5.11 (MIL/uL)   Hemoglobin 11.9 (*) 12.0 - 15.0 (g/dL)   HCT 11.9 (*) 14.7 - 46.0 (%)   MCV 90.3  78.0 - 100.0 (fL)   MCH 30.4  26.0 - 34.0 (pg)   MCHC 33.6  30.0 - 36.0 (g/dL)   RDW 82.9  56.2 - 13.0 (%)   Platelets 326  150 - 400 (K/uL)  DIFFERENTIAL     Status: Abnormal   Collection Time   11/18/11  7:33 PM      Component Value Range   Neutrophils Relative  71  43 - 77 (%)   Neutro Abs 9.0 (*) 1.7 - 7.7 (K/uL)   Lymphocytes Relative 22  12 - 46 (%)   Lymphs Abs 2.8  0.7 - 4.0 (K/uL)   Monocytes Relative 6  3 - 12 (%)   Monocytes Absolute 0.7  0.1 - 1.0 (K/uL)   Eosinophils Relative 2  0 - 5 (%)   Eosinophils Absolute 0.2  0.0 - 0.7 (K/uL)   Basophils Relative 0  0 - 1 (%)   Basophils Absolute 0.0  0.0 - 0.1 (K/uL)   Patient sent to ultrasound. Care turned over to Henrietta Hoover, [facility] at 8:12 pm ED Course  Procedures          [facility], NP 11/18/11 2012  I have accepted care of this pt from [facility], [place].  Results for orders placed during the hospital encounter of 11/18/11 (from the past 24 hour(s))  CBC     Status: Abnormal   Collection Time   11/18/11  7:33 PM      Component Value Range   WBC 12.7 (*) 4.0 - 10.5 (K/uL)   RBC 3.92  3.87 - 5.11 (MIL/uL)   Hemoglobin 11.9 (*) 12.0 - 15.0 (g/dL)   HCT 78.2 (*)  95.6 - 46.0 (%)   MCV 90.3  78.0 - 100.0 (fL)   MCH 30.4  26.0 - 34.0 (pg)   MCHC 33.6  30.0 - 36.0 (g/dL)   RDW 21.3  08.6 - 57.8 (%)   Platelets 326  150 - 400 (K/uL)  DIFFERENTIAL     Status: Abnormal   Collection Time   11/18/11  7:33 PM      Component Value Range   Neutrophils Relative 71  43 - 77 (%)   Neutro Abs 9.0 (*) 1.7 - 7.7 (K/uL)   Lymphocytes Relative 22  12 - 46 (%)   Lymphs Abs 2.8  0.7 - 4.0 (K/uL)   Monocytes Relative 6  3 - 12 (%)   Monocytes Absolute 0.7  0.1 - 1.0 (K/uL)   Eosinophils Relative 2  0 - 5 (%)   Eosinophils Absolute 0.2  0.0 - 0.7 (K/uL)   Basophils Relative 0  0 - 1 (%)   Basophils Absolute 0.0  0.0 - 0.1 (K/uL)  ABO/RH     Status: Normal   Collection Time   11/18/11  7:33 PM      Component Value Range   ABO/RH(D) O POS    HCG, QUANTITATIVE, PREGNANCY     Status: Abnormal   Collection Time   11/18/11  7:33 PM      Component Value Range   hCG, Beta Chain, Quant, S 7478 (*) <5 (mIU/mL)    US Ob Transvaginal  11/18/2011  *RADIOLOGY REPORT*  Clinical Data: Heavy vaginal bleeding.  Possible failed IUP.  TRANSVAGINAL OB ULTRASOUND 11/18/2011:  Technique:  Transvaginal ultrasound was performed for evaluation of the gestation as well as the maternal uterus and adnexal regions.  Comparison: Early OB ultrasound 11/12/2011.  Findings: No visible intrauterine gestational sac on the current examination as was visualized previously.  Thickening of the endometrial stripe with echogenic material in the endometrial canal, maximum thickness approximately 2.3 cm.  Right ovary mildly enlarged measuring approximately 5.0 x 3.2 x 3.7 cm, containing an approximate 5 cm simple cyst. Normal color Doppler signal in the right ovary.  Left ovary normal in size and appearance measuring approximate 2.8 x 1.4 x 2.4 cm.  No free pelvic fluid.  IMPRESSION: No intrauterine gestational sac as was noted on the examination 6 days ago, consistent with a failed IUP; please correlate  with serum beta HCG.  Thickened endometrium with echogenic material consistent with blood clot.  Approximate 5 cm simple cyst arising from the right ovary.  Original Report Authenticated By: Arnell Sieving, M.D.     A/P: SAB: discussed with pt at length. B-hcg is dropping and gestational sac is no longer seen on [place]. However, there was never a yolk sac or definite IUP seen on prior US. Therefore, pt will return on Sunday for repeat B-quant. She is having minimal bleeding at this time. Discussed diet, activity, risks, and precautions.  Clinton Gallant. Oluwademilade Mckiver III, DrHSc, MPAS, PA-C   Henrietta Hoover, [place] 11/18/11 2205

## 2011-11-18 NOTE — Progress Notes (Signed)
H. Neese, NP at bedside.  Assessment done and poc discussed with pt.  

## 2011-11-18 NOTE — Progress Notes (Signed)
Patient states she had a little spotting at 0200, started again at 1700 then became heavy and passing clots. Started cramping and heavy bleeding continued. Patient states on arrival to [facility]U passed a egg size clot in the toilet and changed pads.

## 2011-11-18 NOTE — Progress Notes (Signed)
Jenean Lindau, PA at bedside.  [place] and lab results discussed with pt.  poc discussed with pt.

## 2011-11-24 ENCOUNTER — Inpatient Hospital Stay (HOSPITAL_COMMUNITY)
Admission: AD | Admit: 2011-11-24 | Discharge: 2011-11-24 | Disposition: A | Discharge status: Home / Self Care | Payer: Managed Care, Other (non HMO) | Source: Ambulatory Visit | Attending: Obstetrics & Gynecology | Admitting: Obstetrics & Gynecology

## 2011-11-24 DIAGNOSIS — O039 Complete or unspecified spontaneous abortion without complication: Secondary | ICD-10-CM | POA: Insufficient documentation

## 2011-11-24 LAB — HCG, QUANTITATIVE, PREGNANCY: hCG, Beta Chain, Quant, S: 343 m[IU]/mL — ABNORMAL HIGH (ref <5)

## 2011-11-24 NOTE — ED Notes (Signed)
Pt wanting to go home after blood is drawn. Notified T.BurlesonNP She talked with pt and let her go home and will call her with results and follow up.

## 2011-11-24 NOTE — Progress Notes (Signed)
Pt reprots still having some vaginal bleeding. Denies pain.

## 2011-11-24 NOTE — ED Provider Notes (Signed)
History     Chief Complaint  Patient presents with  . Labs Only   HPI [name] 25 y.o. Has been seen in [facility]U on 2 visits and had falling quants.  Has had heavy bleeding previously and is now having light bleeding.  Denies abdominal pain.  Gestational sac was seen initially on ultrasound, but no sac seen in uterus on ultrasound done 11-18-11.  Today's visit is to verify that quant is still falling as expected.  Has been seen once in the office in this pregnancy.  Was expecting the office to call her with an appointment but she does not have a follow up scheduled yet in the office.   OB History    Grav Para Term Preterm Abortions TAB SAB Ect Mult Living   2 1 1       1       Past Medical History  Diagnosis Date  . Urinary tract infection   . Kidney stone   . Abnormal Pap smear     during preg  . Chlamydia     Past Surgical History  Procedure Date  . Cesarean section     Family History  Problem Relation Age of Onset  . Anesthesia problems Neg Hx     History  Substance Use Topics  . Smoking status: Never Smoker   . Smokeless tobacco: Never Used  . Alcohol Use: No    Allergies: No Known Allergies  Prescriptions prior to admission  Medication Sig Dispense Refill  . amoxicillin (AMOXIL) 500 MG capsule Take 500 mg by mouth 4 (four) times daily.        Marland Kitchen OVER THE COUNTER MEDICATION Take 1 tablet by mouth daily. Gummy prenatal       . Vitamin D, Ergocalciferol, (DRISDOL) 50000 UNITS CAPS Take 50,000 Units by mouth every 7 (seven) days.       . vitamin E 400 UNIT capsule Take 800 Units by mouth daily. Pt is to start this after her 6 wks of Vitamin D 50,000 units, weekly.         ROS Physical Exam   Blood pressure 99/53, pulse 59, temperature 98.9 F (37.2 C), temperature source Oral, resp. rate 18, height 5' 4.5" (1.638 m), weight 205 lb 9.6 oz (93.26 kg).  Physical Exam  Nursing note and vitals reviewed. Constitutional: She is oriented to person, place, and  time. She appears well-developed and well-nourished.  HENT:  Head: Normocephalic.  Eyes: EOM are normal.  Neck: Neck supple.  Musculoskeletal: Normal range of motion.  Neurological: She is alert and oriented to person, place, and time.  Skin: Skin is warm and dry.  Psychiatric: She has a normal mood and affect.    MAU Course  Procedures Results for LORELEY, SCHWALL (MRN [id number]) as of 11/24/2011 18:40  Ref. Range 11/12/2011 12:51 11/18/2011 19:33 11/24/2011 17:09  hCG, Beta Chain, Quant, S Latest Range: <5 mIU/mL 14304 (H) 7478 (H) 343 (H)    MDM Client has had blood drawn.  Requesting to go home and be called with the lab results.  Assessment and Plan  Miscarriage with dropping quants  Plan Called client and reviewed results over the phone. Before she left for home, had reviewed the likely plan with her.  Reviewed again over the phone. Call the office and make a follow up appointment as was discussed in the office last week.   Ezella Kell 11/24/2011, 5:44 PM   Nolene Bernheim, NP 11/24/11 1844  Nolene Bernheim, NP 11/24/11 9147

## 2011-11-24 NOTE — ED Notes (Signed)
NP called pt with results and follow up plan.

## 2011-12-29 ENCOUNTER — Emergency Department (HOSPITAL_COMMUNITY)
Admission: EM | Admit: 2011-12-29 | Discharge: 2011-12-29 | Disposition: A | Discharge status: Home / Self Care | Payer: Managed Care, Other (non HMO) | Attending: Emergency Medicine | Admitting: Emergency Medicine

## 2011-12-29 ENCOUNTER — Encounter: Payer: Self-pay | Admitting: Emergency Medicine

## 2011-12-29 DIAGNOSIS — IMO0001 Reserved for inherently not codable concepts without codable children: Secondary | ICD-10-CM | POA: Insufficient documentation

## 2011-12-29 DIAGNOSIS — J3489 Other specified disorders of nose and nasal sinuses: Secondary | ICD-10-CM | POA: Insufficient documentation

## 2011-12-29 DIAGNOSIS — R51 Headache: Secondary | ICD-10-CM | POA: Insufficient documentation

## 2011-12-29 DIAGNOSIS — J069 Acute upper respiratory infection, unspecified: Secondary | ICD-10-CM | POA: Insufficient documentation

## 2011-12-29 DIAGNOSIS — R6889 Other general symptoms and signs: Secondary | ICD-10-CM | POA: Insufficient documentation

## 2011-12-29 DIAGNOSIS — R070 Pain in throat: Secondary | ICD-10-CM | POA: Insufficient documentation

## 2011-12-29 DIAGNOSIS — R059 Cough, unspecified: Secondary | ICD-10-CM | POA: Insufficient documentation

## 2011-12-29 DIAGNOSIS — R05 Cough: Secondary | ICD-10-CM | POA: Insufficient documentation

## 2011-12-29 NOTE — ED Provider Notes (Signed)
History     CSN: [id number]  Arrival date & time 12/29/11  4098   First MD Initiated Contact with Patient 12/29/11 2110      Chief Complaint  Patient presents with  . Flu Like Symptoms     (Consider location/radiation/quality/duration/timing/severity/associated sxs/prior treatment) Patient is a 25 y.o. female presenting with cough. The history is provided by the patient.  Cough This is a new problem. The current episode started more than 2 days ago. The problem occurs every few minutes. The problem has been gradually improving. The cough is non-productive. There has been no fever. Associated symptoms include ear pain, headaches, rhinorrhea, sore throat and myalgias. Pertinent negatives include no chest pain, no chills, no sweats, no shortness of breath, no wheezing and no eye redness. She has tried nothing for the symptoms. She is not a smoker. Her past medical history does not include asthma.    History reviewed. No pertinent past medical history.  Past Surgical History  Procedure Date  . Cesarean section     History reviewed. No pertinent family history.  History  Substance Use Topics  . Smoking status: Never Smoker   . Smokeless tobacco: Not on file  . Alcohol Use: Yes    OB History    Grav Para Term Preterm Abortions TAB SAB Ect Mult Living                  Review of Systems  Constitutional: Negative for fever and chills.  HENT: Positive for ear pain, congestion, sore throat, rhinorrhea, sneezing and sinus pressure. Negative for hearing loss, neck pain, neck stiffness, dental problem and tinnitus.   Eyes: Negative for pain and redness.  Respiratory: Positive for cough. Negative for chest tightness, shortness of breath and wheezing.   Cardiovascular: Negative for chest pain.  Gastrointestinal: Negative for nausea, vomiting and abdominal pain.  Genitourinary: Negative for dysuria and hematuria.  Musculoskeletal: Positive for myalgias.  Skin: Negative for rash and  wound.  Neurological: Positive for headaches. Negative for syncope, weakness and numbness.  Psychiatric/Behavioral: Negative for confusion.    Allergies  Review of patient's allergies indicates no known allergies.  Home Medications   Current Outpatient Rx  Name Route Sig Dispense Refill  . NORETHIN-ETH ESTRAD TRIPHASIC 0.5/0.75/1-35 MG-MCG PO TABS Oral Take 1 tablet by mouth daily.        BP 110/65  Pulse 65  Temp(Src) 98.9 F (37.2 C) (Oral)  Resp 15  SpO2 100%  LMP 12/29/2011  Physical Exam  Nursing note and vitals reviewed. Constitutional: She is oriented to person, place, and time. She appears well-developed and well-nourished. No distress.  HENT:  Head: Normocephalic and atraumatic.  Right Ear: Hearing, tympanic membrane, external ear and ear canal normal.  Left Ear: Hearing, tympanic membrane, external ear and ear canal normal.  Nose: Nose normal.  Mouth/Throat: Oropharynx is clear and moist. No oropharyngeal exudate.  Eyes: Conjunctivae are normal. Pupils are equal, round, and reactive to light. Right eye exhibits no discharge. Left eye exhibits no discharge. No scleral icterus.  Neck: Normal range of motion. Neck supple.  Cardiovascular: Normal rate, regular rhythm and normal heart sounds.   No murmur heard. Pulmonary/Chest: Effort normal and breath sounds normal. No respiratory distress. She has no wheezes. She exhibits no tenderness.  Abdominal: Soft. Bowel sounds are normal. She exhibits no distension. There is no tenderness.  Musculoskeletal: She exhibits no edema and no tenderness.  Lymphadenopathy:    She has no cervical adenopathy.  Neurological: She is  alert and oriented to person, place, and time. No cranial nerve deficit. Coordination normal.  Skin: Skin is warm and dry. No rash noted.  Psychiatric: Her behavior is normal.    ED Course  Procedures (including critical care time)  Labs Reviewed - No data to display No results found.      MDM    Viral URI sx, with absence of fever do not suspect influenza. Discussed use of OTC medications for symptom relief. Pt voices understanding of plan.        Elwyn Reach [place], [place] 12/29/11 2154

## 2011-12-29 NOTE — ED Notes (Signed)
Pt presented to the ER with c/o flu like s/s, states started on Wednesday, pt states has a cough, body aches, HA, denies fever, states she did not take any OTC at home. Pt also reports feeling better today. Upon assessment lings sounds clear in all lobes.

## 2011-12-30 NOTE — ED Provider Notes (Signed)
Medical screening examination/treatment/procedure(s) were performed by non-physician practitioner and as supervising physician I was immediately available for consultation/collaboration. Kindal Ponti Y.   Su Duma Y. Wyolene Weimann, MD 12/30/11 1025 

## 2012-01-10 ENCOUNTER — Encounter (HOSPITAL_COMMUNITY): Payer: Self-pay | Admitting: *Deleted

## 2012-04-30 ENCOUNTER — Emergency Department (HOSPITAL_COMMUNITY)
Admission: EM | Admit: 2012-04-30 | Discharge: 2012-04-30 | Disposition: A | Discharge status: Home / Self Care | Payer: Managed Care, Other (non HMO) | Attending: Emergency Medicine | Admitting: Emergency Medicine

## 2012-04-30 ENCOUNTER — Encounter (HOSPITAL_COMMUNITY): Payer: Self-pay | Admitting: *Deleted

## 2012-04-30 DIAGNOSIS — R109 Unspecified abdominal pain: Secondary | ICD-10-CM | POA: Insufficient documentation

## 2012-04-30 DIAGNOSIS — R112 Nausea with vomiting, unspecified: Secondary | ICD-10-CM | POA: Insufficient documentation

## 2012-04-30 DIAGNOSIS — R110 Nausea: Secondary | ICD-10-CM

## 2012-04-30 LAB — URINALYSIS, ROUTINE W REFLEX MICROSCOPIC
Bilirubin Urine: NEGATIVE
Glucose, UA: NEGATIVE mg/dL
Ketones, ur: NEGATIVE mg/dL
Nitrite: POSITIVE — AB
Protein, ur: NEGATIVE mg/dL
Specific Gravity, Urine: 1.026 (ref 1.005–1.030)
Urobilinogen, UA: 0.2 mg/dL (ref 0.0–1.0)
pH: 7 (ref 5.0–8.0)

## 2012-04-30 LAB — URINE MICROSCOPIC-ADD ON

## 2012-04-30 LAB — PREGNANCY, URINE: Preg Test, Ur: NEGATIVE

## 2012-04-30 MED ORDER — ONDANSETRON 8 MG PO TBDP
8.0000 mg | ORAL_TABLET | Freq: Once | ORAL | Status: AC
  Administered 2012-04-30: 8 mg via ORAL
  Filled 2012-04-30: qty 1

## 2012-04-30 MED ORDER — ONDANSETRON 8 MG PO TBDP: 8.0000 mg | ORAL_TABLET | Freq: Three times a day (TID) | ORAL | Status: AC | PRN

## 2012-04-30 MED ORDER — OXYCODONE-ACETAMINOPHEN 5-325 MG PO TABS: 1.0000 | ORAL_TABLET | Freq: Four times a day (QID) | ORAL | Status: AC | PRN

## 2012-04-30 NOTE — Discharge Instructions (Signed)
Abdominal Pain  Abdominal pain can be caused by many things. Your caregiver decides the seriousness of your pain by an examination and possibly blood tests and X-rays. Many cases can be observed and treated at home. Most abdominal pain is not caused by a disease and will probably improve without treatment. However, in many cases, more time must pass before a clear cause of the pain can be found. Before that point, it may not be known if you need more testing, or if hospitalization or surgery is needed.  HOME CARE INSTRUCTIONS    Do not take laxatives unless directed by your caregiver.   Take pain medicine only as directed by your caregiver.   Only take over-the-counter or prescription medicines for pain, discomfort, or fever as directed by your caregiver.   Try a clear liquid diet (broth, tea, or water) for as long as directed by your caregiver. Slowly move to a bland diet as tolerated.  SEEK IMMEDIATE MEDICAL CARE IF:    The pain does not go away.   You have a fever.   You keep throwing up (vomiting).   The pain is felt only in portions of the abdomen. Pain in the right side could possibly be appendicitis. In an adult, pain in the left lower portion of the abdomen could be colitis or diverticulitis.   You pass bloody or black tarry stools.  MAKE SURE YOU:    Understand these instructions.   Will watch your condition.   Will get help right away if you are not doing well or get worse.  Document Released: 09/25/2005 Document Revised: 12/05/2011 Document Reviewed: 08/03/2008  Exit[employer] Patient Information 2012 [employer].    Nausea and Vomiting  Nausea is a sick feeling that often comes before throwing up (vomiting). Vomiting is a reflex where stomach contents come out of your mouth. Vomiting can cause severe loss of body fluids (dehydration). Children and elderly adults can become dehydrated quickly, especially if they also have diarrhea. Nausea and vomiting are symptoms of a condition or disease. It  is important to find the cause of your symptoms.  CAUSES    Direct irritation of the stomach lining. This irritation can result from increased acid production (gastroesophageal reflux disease), infection, food poisoning, taking certain medicines (such as nonsteroidal anti-inflammatory drugs), alcohol use, or tobacco use.   Signals from the brain.These signals could be caused by a headache, heat exposure, an inner ear disturbance, increased pressure in the brain from injury, infection, a tumor, or a concussion, pain, emotional stimulus, or metabolic problems.   An obstruction in the gastrointestinal tract (bowel obstruction).   Illnesses such as diabetes, hepatitis, gallbladder problems, appendicitis, kidney problems, cancer, sepsis, atypical symptoms of a heart attack, or eating disorders.   Medical treatments such as chemotherapy and radiation.   Receiving medicine that makes you sleep (general anesthetic) during surgery.  DIAGNOSIS  Your caregiver may ask for tests to be done if the problems do not improve after a few days. Tests may also be done if symptoms are severe or if the reason for the nausea and vomiting is not clear. Tests may include:   Urine tests.   Blood tests.   Stool tests.   Cultures (to look for evidence of infection).   X-rays or other imaging studies.  Test results can help your caregiver make decisions about treatment or the need for additional tests.  TREATMENT  You need to stay well hydrated. Drink frequently but in small amounts.You may wish to   drink water, sports drinks, clear broth, or eat frozen ice pops or gelatin dessert to help stay hydrated.When you eat, eating slowly may help prevent nausea.There are also some antinausea medicines that may help prevent nausea.  HOME CARE INSTRUCTIONS    Take all medicine as directed by your caregiver.   If you do not have an appetite, do not force yourself to eat. However, you must continue to drink fluids.   If you have an  appetite, eat a normal diet unless your caregiver tells you differently.   Eat a variety of complex carbohydrates (rice, wheat, potatoes, bread), lean meats, yogurt, fruits, and vegetables.   Avoid high-fat foods because they are more difficult to digest.   Drink enough water and fluids to keep your urine clear or pale yellow.   If you are dehydrated, ask your caregiver for specific rehydration instructions. Signs of dehydration may include:   Severe thirst.   Dry lips and mouth.   Dizziness.   Dark urine.   Decreasing urine frequency and amount.   Confusion.   Rapid breathing or pulse.  SEEK IMMEDIATE MEDICAL CARE IF:    You have blood or brown flecks (like coffee grounds) in your vomit.   You have black or bloody stools.   You have a severe headache or stiff neck.   You are confused.   You have severe abdominal pain.   You have chest pain or trouble breathing.   You do not urinate at least once every 8 hours.   You develop cold or clammy skin.   You continue to vomit for longer than 24 to 48 hours.   You have a fever.  MAKE SURE YOU:    Understand these instructions.   Will watch your condition.   Will get help right away if you are not doing well or get worse.  Document Released: 12/16/2005 Document Revised: 12/05/2011 Document Reviewed: 05/15/2011  [employer] Patient Information 2012 [employer].

## 2012-04-30 NOTE — ED Notes (Signed)
States has thrown up one time since yesterday. No diarrhea. Slight cramping.

## 2012-04-30 NOTE — ED Notes (Signed)
Pt states "I vomited yesterday, my stomach hurts, no diarrhea, feel nauseous"

## 2012-04-30 NOTE — ED Provider Notes (Signed)
History     CSN: [id number]  Arrival date & time 04/30/12  1644   First MD Initiated Contact with Patient 04/30/12 1707      Chief Complaint  Patient presents with  . Nausea  . Emesis  . Abdominal Pain    (Consider location/radiation/quality/duration/timing/severity/associated sxs/prior treatment) Patient is a 26 y.o. female presenting with vomiting and abdominal pain. The history is provided by the patient.  Emesis  This is a new problem. Associated symptoms include abdominal pain. Pertinent negatives include no diarrhea and no headaches.  Abdominal Pain The primary symptoms of the illness include abdominal pain, nausea and vomiting. The primary symptoms of the illness do not include shortness of breath or diarrhea.  Symptoms associated with the illness do not include back pain.   patient states that she vomited once yesterday and she feels nauseous. She's not had diarrhea. He states her abdomen also hurts. It is a dull constant pain. No fevers. No dysuria. No vaginal bleeding or discharge. She's not had a clear sick contacts. Her last period was the middle of April, and was normal. She states she hopes she's not pregnant. No hemoptysis or hematemesis. No blood in stool. She does have decreased appetite. No lightheadedness or dizziness.  Past Medical History  Diagnosis Date  . Urinary tract infection   . Kidney stone   . Abnormal Pap smear     during preg  . Chlamydia     Past Surgical History  Procedure Date  . Cesarean section   . Cesarean section     Family History  Problem Relation Age of Onset  . Anesthesia problems Neg Hx     History  Substance Use Topics  . Smoking status: Never Smoker   . Smokeless tobacco: Not on file  . Alcohol Use: Yes     rarely    OB History    Grav Para Term Preterm Abortions TAB SAB Ect Mult Living   2 1 1       1       Review of Systems  Constitutional: Negative for activity change and appetite change.  HENT: Negative for  neck stiffness.   Eyes: Negative for pain.  Respiratory: Negative for chest tightness and shortness of breath.   Cardiovascular: Negative for chest pain and leg swelling.  Gastrointestinal: Positive for nausea, vomiting and abdominal pain. Negative for diarrhea.  Genitourinary: Negative for flank pain.  Musculoskeletal: Negative for back pain.  Skin: Negative for rash.  Neurological: Negative for weakness, numbness and headaches.  Psychiatric/Behavioral: Negative for behavioral problems.    Allergies  Review of patient's allergies indicates no known allergies.  Home Medications   Current Outpatient Rx  Name Route Sig Dispense Refill  . ONDANSETRON 8 MG PO TBDP Oral Take 1 tablet (8 mg total) by mouth every 8 (eight) hours as needed for nausea. 20 tablet 0  . OXYCODONE-ACETAMINOPHEN 5-325 MG PO TABS Oral Take 1-2 tablets by mouth every 6 (six) hours as needed for pain. 6 tablet 0    BP 110/65  Pulse 64  Temp(Src) 99 F (37.2 C) (Oral)  Resp 16  Wt 201 lb (91.173 kg)  SpO2 100%  LMP 04/15/2012  Breastfeeding? No  Physical Exam  Nursing note and vitals reviewed. Constitutional: She is oriented to person, place, and time. She appears well-developed and well-nourished.  HENT:  Head: Normocephalic and atraumatic.  Cardiovascular: Normal rate, regular rhythm and normal heart sounds.   No murmur heard. Pulmonary/Chest: Effort normal and breath sounds  normal. No respiratory distress. She has no wheezes. She has no rales.  Abdominal: Soft. Bowel sounds are normal. She exhibits no distension. There is no tenderness. There is no rebound and no guarding.  Musculoskeletal: Normal range of motion.  Neurological: She is alert and oriented to person, place, and time. No cranial nerve deficit.  Skin: Skin is warm and dry.  Psychiatric: She has a normal mood and affect. Her speech is normal.    ED Course  Procedures (including critical care time)  Labs Reviewed  URINALYSIS, ROUTINE W  REFLEX MICROSCOPIC - Abnormal; Notable for the following:    APPearance CLOUDY (*)    Hgb urine dipstick SMALL (*)    Nitrite POSITIVE (*)    Leukocytes, UA TRACE (*)    All other components within normal limits  URINE MICROSCOPIC-ADD ON - Abnormal; Notable for the following:    Squamous Epithelial / LPF MANY (*)    Bacteria, UA MANY (*)    All other components within normal limits  PREGNANCY, URINE  URINE CULTURE   No results found.   1. Nausea   2. Abdominal pain       MDM  Nausea vomiting yesterday. Abdominal pain yesterday and today. Benign abdominal exam without tenderness. He has tolerated orals here. Urinalysis shows many bacteria but only 1-2 white cells. Urine culture was sent and patient will now be treated at this time. Patient be discharged home to followup as needed.        Juliet Rude. Rubin Payor, MD 04/30/12 1954

## 2012-05-02 LAB — URINE CULTURE
Colony Count: >=100000
Culture  Setup Time: 201305030128

## 2012-05-03 NOTE — ED Notes (Signed)
+   Urine Chart sent to EDP office for review. 

## 2012-05-07 NOTE — ED Notes (Signed)
Rx written by  Charm Rings for Bactrim 800/160 sig: 1 po BID disp #6 need to be called to pharmacy. Wrong Epic number-letter sent

## 2013-08-11 ENCOUNTER — Encounter (HOSPITAL_COMMUNITY): Payer: Self-pay

## 2013-08-11 ENCOUNTER — Inpatient Hospital Stay (HOSPITAL_COMMUNITY)
Admission: AD | Admit: 2013-08-11 | Discharge: 2013-08-11 | Discharge status: Against Medical Advice | Payer: BC Managed Care – PPO | Source: Ambulatory Visit | Attending: Obstetrics & Gynecology | Admitting: Obstetrics & Gynecology

## 2013-08-11 DIAGNOSIS — N949 Unspecified condition associated with female genital organs and menstrual cycle: Secondary | ICD-10-CM

## 2013-08-11 DIAGNOSIS — R109 Unspecified abdominal pain: Secondary | ICD-10-CM

## 2013-08-11 DIAGNOSIS — O99891 Other specified diseases and conditions complicating pregnancy: Secondary | ICD-10-CM

## 2013-08-11 LAB — URINE MICROSCOPIC-ADD ON

## 2013-08-11 LAB — URINALYSIS, ROUTINE W REFLEX MICROSCOPIC
Bilirubin Urine: NEGATIVE
Glucose, UA: NEGATIVE mg/dL
Ketones, ur: NEGATIVE mg/dL
Nitrite: NEGATIVE
Protein, ur: NEGATIVE mg/dL
Specific Gravity, Urine: 1.02 (ref 1.005–1.030)
Urobilinogen, UA: 0.2 mg/dL (ref 0.0–1.0)
pH: 7 (ref 5.0–8.0)

## 2013-08-11 LAB — POCT PREGNANCY, URINE: Preg Test, Ur: POSITIVE — AB

## 2013-08-11 NOTE — MAU Note (Signed)
Patient states she has had a positive home pregnancy test yesterday. States she started having abdominal cramping about one week ago before did the test. Has a vaginal discharge with a history of BV. No bleeding.

## 2013-08-11 NOTE — MAU Provider Note (Signed)
History     CSN: [id number]  Arrival date and time: 08/11/13 1622   None     Chief Complaint  Patient presents with  . Possible Pregnancy  . Abdominal Pain   HPI [name] is 27 y.o. G3P1001 [redacted]w[redacted]d weeks presenting with a 1 week hx of abdominal pain.  Rates pain as 5/10 that is intermittent.  She states she had a + UPT at home yesterday.  Has a vaginal discharge, hx of BV.  She is concerned because she had a miscarriage 10/2011 at 12 weeks.  When I entered room to get HPI, the patient states she has to leave to pick up her son.  She plans to call Dr. Marcia Brash office tomorrow for an appointment. She appears comfortable, smiling.   Past Medical History  Diagnosis Date  . Urinary tract infection   . Kidney stone   . Abnormal Pap smear     during preg  . Chlamydia     Past Surgical History  Procedure Laterality Date  . Cesarean section    . Cesarean section      Family History  Problem Relation Age of Onset  . Anesthesia problems Neg Hx     History  Substance Use Topics  . Smoking status: Never Smoker   . Smokeless tobacco: Not on file  . Alcohol Use: Yes     Comment: rarely    Allergies: No Known Allergies  No prescriptions prior to admission    Review of Systems  Constitutional:       + for "I eat a lot of starch"  Gastrointestinal: Positive for abdominal pain.  Genitourinary: Negative for dysuria, urgency and frequency.       + for vaginal discharge.  Neg for vaginal bleeding   Physical Exam   Blood pressure 113/62, pulse 87, temperature 98.8 F (37.1 C), temperature source Oral, resp. rate 16, height 5\' 5"  (1.651 m), weight 210 lb (95.255 kg), last menstrual period 07/08/2013, SpO2 100.00%.  Physical Exam  Constitutional: She is oriented to person, place, and time. She appears well-developed and well-nourished. No distress.  Neurological: She is alert and oriented to person, place, and time.  Psychiatric: She has a normal mood and affect.  Her behavior is normal.   Results for orders placed during the hospital encounter of 08/11/13 (from the past 24 hour(s))  URINALYSIS, ROUTINE W REFLEX MICROSCOPIC     Status: Abnormal   Collection Time    08/11/13  4:30 PM      Result Value Range   Color, Urine STRAW (*) YELLOW   APPearance CLEAR  CLEAR   Specific Gravity, Urine 1.020  1.005 - 1.030   pH 7.0  5.0 - 8.0   Glucose, UA NEGATIVE  NEGATIVE mg/dL   Hgb urine dipstick TRACE (*) NEGATIVE   Bilirubin Urine NEGATIVE  NEGATIVE   Ketones, ur NEGATIVE  NEGATIVE mg/dL   Protein, ur NEGATIVE  NEGATIVE mg/dL   Urobilinogen, UA 0.2  0.0 - 1.0 mg/dL   Nitrite NEGATIVE  NEGATIVE   Leukocytes, UA SMALL (*) NEGATIVE  URINE MICROSCOPIC-ADD ON     Status: Abnormal   Collection Time    08/11/13  4:30 PM      Result Value Range   Squamous Epithelial / LPF MANY (*) RARE   WBC, UA 0-2  <3 WBC/hpf   Bacteria, UA FEW (*) RARE   Urine-Other STARCH CRYSTALS PRESENT    POCT PREGNANCY, URINE     Status: Abnormal  Collection Time    08/11/13  4:47 PM      Result Value Range   Preg Test, Ur POSITIVE (*) NEGATIVE   MAU Course  Procedures  MDM Patient signed out AMA  Assessment and Plan  Discussion with the patient included that I could not evaluate the pain or the discharge without an exam,  causes of abdominal pain in early pregnancy included ectopic.  Instructed to return for increased pain or vaginal bleeding.  She agreed to call Dr. Tamela Oddi tomorrow for an appointment  Zeven Kocak,EVE M 08/11/2013, 6:34 PM

## 2013-08-25 ENCOUNTER — Ambulatory Visit (INDEPENDENT_AMBULATORY_CARE_PROVIDER_SITE_OTHER): Payer: BC Managed Care – PPO | Admitting: Obstetrics

## 2013-08-25 ENCOUNTER — Encounter: Payer: Self-pay | Admitting: Obstetrics

## 2013-08-25 VITALS — BP 130/74 | Temp 98.1°F

## 2013-08-25 DIAGNOSIS — Z3481 Encounter for supervision of other normal pregnancy, first trimester: Secondary | ICD-10-CM

## 2013-08-25 DIAGNOSIS — Z3201 Encounter for pregnancy test, result positive: Secondary | ICD-10-CM

## 2013-08-25 NOTE — Progress Notes (Signed)
Subjective:    [name] is being seen today for her first obstetrical visit.  This is not a planned pregnancy. She is at [redacted]w[redacted]d gestation. Her obstetrical history is significant for obesity. Relationship with FOB: friends not together or living together. Patient does not intend to breast feed. Pregnancy history fully reviewed.  Menstrual History: OB History   Grav Para Term Preterm Abortions TAB SAB Ect Mult Living   3 1 1  1  1   1       Last Pap: 2012 Abnormal Menarche age: 23 Regular Patient's last menstrual period was 07/08/2013.    The following portions of the patient's history were reviewed and updated as appropriate: allergies, current medications, past family history, past medical history, past social history, past surgical history and problem list.  Review of Systems Pertinent items are noted in HPI.    Objective:    General appearance: alert and no distress Abdomen: normal findings: soft, non-tender Pelvic: cervix normal in appearance, external genitalia normal, no adnexal masses or tenderness, no cervical motion tenderness, uterus normal size, shape, and consistency and vagina normal without discharge    Assessment:    Pregnancy at [redacted]w[redacted]d weeks    Plan:    Initial labs drawn. Prenatal vitamins.  Counseling provided regarding continued use of seat belts, cessation of alcohol consumption, smoking or use of illicit drugs; infection precautions i.e., influenza/TDAP immunizations, toxoplasmosis,CMV, parvovirus, listeria and varicella; workplace safety, exercise during pregnancy; routine dental care, safe medications, sexual activity, hot tubs, saunas, pools, travel, caffeine use, fish and methlymercury, potential toxins, hair treatments, varicose veins Weight gain recommendations reviewed: underweight/BMI< 18.5--> gain 28 - 40 lbs; normal weight/BMI 18.5 - 24.9--> gain 25 - 35 lbs; overweight/BMI 25 - 29.9--> gain 15 - 25 lbs; obese/BMI >30->gain  11 - 20 lbs Problem  list reviewed and updated. AFP3 discussed: requested. Role of ultrasound in pregnancy discussed; fetal survey: requested. Amniocentesis discussed: not indicated. Follow up in 4 weeks. 50% of 20 min visit spent on counseling and coordination of care.

## 2013-08-26 ENCOUNTER — Encounter: Payer: Self-pay | Admitting: Obstetrics

## 2013-08-26 LAB — POCT URINALYSIS DIPSTICK
Bilirubin, UA: NEGATIVE
Blood, UA: NEGATIVE
Glucose, UA: NEGATIVE
Ketones, UA: NEGATIVE
Leukocytes, UA: NEGATIVE
Nitrite, UA: NEGATIVE
Protein, UA: NEGATIVE
Spec Grav, UA: 1.015
Urobilinogen, UA: 4
pH, UA: 6

## 2013-08-26 LAB — OBSTETRIC PANEL
Antibody Screen: NEGATIVE
Basophils Absolute: 0 10*3/uL (ref 0.0–0.1)
Basophils Relative: 0 % (ref 0–1)
Eosinophils Absolute: 0.3 10*3/uL (ref 0.0–0.7)
Eosinophils Relative: 2 % (ref 0–5)
HCT: 35.7 % — ABNORMAL LOW (ref 36.0–46.0)
Hemoglobin: 11.7 g/dL — ABNORMAL LOW (ref 12.0–15.0)
Hepatitis B Surface Ag: NEGATIVE
Lymphocytes Relative: 22 % (ref 12–46)
Lymphs Abs: 3 10*3/uL (ref 0.7–4.0)
MCH: 29.3 pg (ref 26.0–34.0)
MCHC: 32.8 g/dL (ref 30.0–36.0)
MCV: 89.5 fL (ref 78.0–100.0)
Monocytes Absolute: 1.2 10*3/uL — ABNORMAL HIGH (ref 0.1–1.0)
Monocytes Relative: 9 % (ref 3–12)
Neutro Abs: 9 10*3/uL — ABNORMAL HIGH (ref 1.7–7.7)
Neutrophils Relative %: 67 % (ref 43–77)
Platelets: 359 10*3/uL (ref 150–400)
RBC: 3.99 MIL/uL (ref 3.87–5.11)
RDW: 13.8 % (ref 11.5–15.5)
Rh Type: POSITIVE
Rubella: 5.18 Index — ABNORMAL HIGH (ref <0.90)
WBC: 13.4 10*3/uL — ABNORMAL HIGH (ref 4.0–10.5)

## 2013-08-26 LAB — GC/CHLAMYDIA PROBE AMP
CT Probe RNA: NEGATIVE
GC Probe RNA: NEGATIVE

## 2013-08-26 LAB — WET PREP BY MOLECULAR PROBE
Candida species: NEGATIVE
Gardnerella vaginalis: NEGATIVE
Trichomonas vaginosis: NEGATIVE

## 2013-08-26 LAB — CULTURE, OB URINE
Colony Count: NO GROWTH
Organism ID, Bacteria: NO GROWTH

## 2013-08-26 LAB — VITAMIN D 25 HYDROXY (VIT D DEFICIENCY, FRACTURES): Vit D, 25-Hydroxy: 13 ng/mL — ABNORMAL LOW (ref 30–89)

## 2013-08-26 LAB — HIV ANTIBODY (ROUTINE TESTING W REFLEX): HIV: NONREACTIVE

## 2013-08-26 LAB — VARICELLA ZOSTER ANTIBODY, IGG: Varicella IgG: 1704 Index — ABNORMAL HIGH (ref <135.00)

## 2013-08-27 LAB — PAP IG W/ RFLX HPV ASCU

## 2013-08-27 LAB — HEMOGLOBINOPATHY EVALUATION
Hemoglobin Other: 0 %
Hgb A2 Quant: 2.6 % (ref 2.2–3.2)
Hgb A: 96.9 % (ref 96.8–97.8)
Hgb F Quant: 0.5 % (ref 0.0–2.0)
Hgb S Quant: 0 %

## 2013-09-01 ENCOUNTER — Encounter (HOSPITAL_COMMUNITY): Payer: Self-pay | Admitting: *Deleted

## 2013-09-01 ENCOUNTER — Inpatient Hospital Stay (HOSPITAL_COMMUNITY)
Admission: AD | Admit: 2013-09-01 | Discharge: 2013-09-01 | Disposition: A | Discharge status: Home / Self Care | Payer: BC Managed Care – PPO | Source: Ambulatory Visit | Attending: Obstetrics & Gynecology | Admitting: Obstetrics & Gynecology

## 2013-09-01 DIAGNOSIS — O99891 Other specified diseases and conditions complicating pregnancy: Secondary | ICD-10-CM | POA: Insufficient documentation

## 2013-09-01 DIAGNOSIS — S300XXA Contusion of lower back and pelvis, initial encounter: Secondary | ICD-10-CM

## 2013-09-01 DIAGNOSIS — Y92009 Unspecified place in unspecified non-institutional (private) residence as the place of occurrence of the external cause: Secondary | ICD-10-CM | POA: Insufficient documentation

## 2013-09-01 DIAGNOSIS — W07XXXA Fall from chair, initial encounter: Secondary | ICD-10-CM | POA: Insufficient documentation

## 2013-09-01 DIAGNOSIS — M549 Dorsalgia, unspecified: Secondary | ICD-10-CM | POA: Insufficient documentation

## 2013-09-01 MED ORDER — HYDROCODONE-ACETAMINOPHEN 5-325 MG PO TABS: 1.0000 | ORAL_TABLET | Freq: Four times a day (QID) | ORAL | Status: DC | PRN

## 2013-09-01 NOTE — MAU Provider Note (Signed)
  History     CSN: [id number]  Arrival date and time: 09/01/13 1712   None     Chief Complaint  Patient presents with  . Back Pain   HPI This is a 27 y.o. female at [redacted]w[redacted]d who presents with c/o falling out of a chair earlier this afternoon. Now has low back pain.  No loss of function and able to walk, though it hurts some.  RN Note: About 1415, went to sit in chair and missed, landed on the floor. Back is hurting       OB History   Grav Para Term Preterm Abortions TAB SAB Ect Mult Living   3 1 1  1  1   1       Past Medical History  Diagnosis Date  . Urinary tract infection   . Kidney stone   . Abnormal Pap smear     during preg  . Chlamydia     Past Surgical History  Procedure Laterality Date  . Cesarean section    . Cesarean section    . No past surgeries      Family History  Problem Relation Age of Onset  . Anesthesia problems Neg Hx     History  Substance Use Topics  . Smoking status: Never Smoker   . Smokeless tobacco: Not on file  . Alcohol Use: No     Comment: rarely    Allergies: No Known Allergies  Prescriptions prior to admission  Medication Sig Dispense Refill  . acetaminophen (TYLENOL) 500 MG tablet Take 1,000 mg by mouth every 6 (six) hours as needed for pain (headache).      . Pediatric Multiple Vit-C-FA (FLINSTONES GUMMIES OMEGA-3 DHA) CHEW Chew by mouth.        Review of Systems  Constitutional: Negative for fever, chills and malaise/fatigue.  Gastrointestinal: Negative for abdominal pain.  Musculoskeletal: Positive for back pain and falls.  Neurological: Negative for dizziness, focal weakness and weakness.   Physical Exam   Blood pressure 109/62, pulse 83, temperature 98.3 F (36.8 C), temperature source Oral, resp. rate 18, height 5\' 5"  (1.651 m), weight 95.8 kg (211 lb 3.2 oz), last menstrual period 07/08/2013.  Physical Exam  Constitutional: She is oriented to person, place, and time. She appears well-developed and  well-nourished. No distress.  Cardiovascular: Normal rate.   Respiratory: Effort normal.  GI: Soft. There is no tenderness.  Musculoskeletal: Normal range of motion. She exhibits tenderness (over low back).  Negative straight leg raise bilaterally Bilateral plantar and dorsiflexion strong and equal  Neurological: She is alert and oriented to person, place, and time.  Skin: Skin is warm and dry.  Psychiatric: She has a normal mood and affect.    MAU Course  Procedures  Assessment and Plan  A:  SIUP at [redacted]w[redacted]d       S/P fall from chair      Low back pain      No focal weakness  P:  Discussed with patient       Conservative care       Rx Vicodin for pain      Rest and ice      Follow up with prenatal care  [facility] 09/01/2013, 6:15 PM

## 2013-09-01 NOTE — MAU Note (Signed)
About 1415, went to sit in chair and missed, landed on the floor.  Back is hurting

## 2013-09-08 ENCOUNTER — Encounter: Payer: Self-pay | Admitting: Obstetrics

## 2013-09-16 ENCOUNTER — Telehealth: Payer: Self-pay | Admitting: *Deleted

## 2013-09-16 DIAGNOSIS — B373 Candidiasis of vulva and vagina: Secondary | ICD-10-CM

## 2013-09-16 MED ORDER — TERCONAZOLE 0.4 % VA CREA: 1.0000 | TOPICAL_CREAM | Freq: Every day | VAGINAL | Status: AC

## 2013-09-16 NOTE — Telephone Encounter (Signed)
Patient states she is having yeast symptoms and she is requesting medication. Terazol Rx per protocol.

## 2013-09-22 ENCOUNTER — Encounter: Payer: BC Managed Care – PPO | Admitting: Obstetrics

## 2013-10-12 ENCOUNTER — Encounter (HOSPITAL_COMMUNITY): Payer: Self-pay | Admitting: *Deleted

## 2013-10-12 ENCOUNTER — Inpatient Hospital Stay (HOSPITAL_COMMUNITY)
Admission: AD | Admit: 2013-10-12 | Discharge: 2013-10-12 | Disposition: A | Discharge status: Home / Self Care | Payer: BC Managed Care – PPO | Source: Ambulatory Visit | Attending: Obstetrics and Gynecology | Admitting: Obstetrics and Gynecology

## 2013-10-12 ENCOUNTER — Inpatient Hospital Stay (HOSPITAL_COMMUNITY): Payer: BC Managed Care – PPO

## 2013-10-12 DIAGNOSIS — O210 Mild hyperemesis gravidarum: Secondary | ICD-10-CM | POA: Insufficient documentation

## 2013-10-12 LAB — URINALYSIS, ROUTINE W REFLEX MICROSCOPIC
Bilirubin Urine: NEGATIVE
Glucose, UA: NEGATIVE mg/dL
Ketones, ur: NEGATIVE mg/dL
Leukocytes, UA: NEGATIVE
Nitrite: NEGATIVE
Protein, ur: NEGATIVE mg/dL
Specific Gravity, Urine: 1.01 (ref 1.005–1.030)
Urobilinogen, UA: 0.2 mg/dL (ref 0.0–1.0)
pH: 8.5 — ABNORMAL HIGH (ref 5.0–8.0)

## 2013-10-12 LAB — URINE MICROSCOPIC-ADD ON

## 2013-10-12 MED ORDER — PROMETHAZINE HCL 25 MG/ML IJ SOLN
25.0000 mg | Freq: Once | INTRAVENOUS | Status: AC
  Administered 2013-10-12: 25 mg via INTRAVENOUS
  Filled 2013-10-12: qty 1

## 2013-10-12 MED ORDER — DOXYLAMINE-PYRIDOXINE 10-10 MG PO TBEC: 10.0000 mg | DELAYED_RELEASE_TABLET | Freq: Every evening | ORAL | Status: DC | PRN

## 2013-10-12 NOTE — MAU Provider Note (Signed)
  History     CSN: [id number]  Arrival date and time: 10/12/13 1052   First Provider Initiated Contact with Patient 10/12/13 1255      Chief Complaint  Patient presents with  . Emesis   HPI Pt presents with n/v for one day.  She is taking phenergan without relief.    OB History   Grav Para Term Preterm Abortions TAB SAB Ect Mult Living   3 1 1  1  1   1       Past Medical History  Diagnosis Date  . Urinary tract infection   . Kidney stone   . Abnormal Pap smear     during preg  . Chlamydia     Past Surgical History  Procedure Laterality Date  . Cesarean section    . Cesarean section    . No past surgeries      Family History  Problem Relation Age of Onset  . Anesthesia problems Neg Hx   . Alcohol abuse Neg Hx     History  Substance Use Topics  . Smoking status: Never Smoker   . Smokeless tobacco: Not on file  . Alcohol Use: No     Comment: rarely    Allergies: No Known Allergies  Prescriptions prior to admission  Medication Sig Dispense Refill  . acetaminophen (TYLENOL) 500 MG tablet Take 1,000 mg by mouth every 6 (six) hours as needed for pain (headache).      . Pediatric Multiple Vit-C-FA (FLINSTONES GUMMIES OMEGA-3 DHA) CHEW Chew 1 tablet by mouth daily.       . promethazine (PHENERGAN) 25 MG tablet Take 25 mg by mouth every 6 (six) hours as needed for nausea.        ROS Physical Exam   Blood pressure 113/68, pulse 78, temperature 99.2 F (37.3 C), resp. rate 20, height 5\' 5"  (1.651 m), weight 210 lb 6.4 oz (95.437 kg), last menstrual period 07/08/2013.  Physical Exam Physical Examination: General appearance - alert, well appearing, and in no distress Chest - clear to auscultation, no wheezes, rales or rhonchi, symmetric air entry Heart - normal rate and regular rhythm Abdomen - soft, nontender, nondistended, no masses or organomegaly Fundus 20 weeks size Pelvic - cx l/cl Extremities - peripheral pulses normal, no pedal edema, no clubbing or  cyanosis, Homan's sign negative bilaterally  MAU Course  Procedures  MDM   Assessment and Plan  N/v of pregnacy S>d ivf with phenergan PO challenge Limited US for dating If able to tolerate PO DC home  Mckennah Kretchmer A 10/12/2013, 1:02 PM

## 2013-10-12 NOTE — Progress Notes (Signed)
Dr Normand Sloop called and message left as to pt's admission and status and to return call.

## 2013-10-12 NOTE — Progress Notes (Signed)
Dr Normand Sloop called in and aware of pt's admission and status. Orders received and MD will see pt

## 2013-10-12 NOTE — Progress Notes (Signed)
Pt up to BR before pelvic exam

## 2013-10-12 NOTE — MAU Note (Signed)
Ongoing problem with vomiting.  Has medication. No pain or bleeding.

## 2013-10-12 NOTE — Progress Notes (Signed)
Dr Normand Sloop notified of pt's u/s report, pt tolerating crackers and sprit. Pt stable for d/c home

## 2013-10-12 NOTE — Progress Notes (Signed)
Written and verbal d/c instructions given and understanding voiced. 

## 2013-10-13 LAB — URINE CULTURE: Colony Count: 50000

## 2013-11-14 ENCOUNTER — Inpatient Hospital Stay (HOSPITAL_COMMUNITY)
Admission: AD | Admit: 2013-11-14 | Discharge: 2013-11-14 | Disposition: A | Discharge status: Home / Self Care | Payer: BC Managed Care – PPO | Source: Ambulatory Visit | Attending: Obstetrics and Gynecology | Admitting: Obstetrics and Gynecology

## 2013-11-14 ENCOUNTER — Encounter (HOSPITAL_COMMUNITY): Payer: Self-pay | Admitting: *Deleted

## 2013-11-14 DIAGNOSIS — O99891 Other specified diseases and conditions complicating pregnancy: Secondary | ICD-10-CM | POA: Insufficient documentation

## 2013-11-14 DIAGNOSIS — N949 Unspecified condition associated with female genital organs and menstrual cycle: Secondary | ICD-10-CM | POA: Insufficient documentation

## 2013-11-14 LAB — URINALYSIS, ROUTINE W REFLEX MICROSCOPIC
Bilirubin Urine: NEGATIVE
Glucose, UA: NEGATIVE mg/dL
Hgb urine dipstick: NEGATIVE
Ketones, ur: NEGATIVE mg/dL
Leukocytes, UA: NEGATIVE
Nitrite: NEGATIVE
Protein, ur: NEGATIVE mg/dL
Specific Gravity, Urine: 1.015 (ref 1.005–1.030)
Urobilinogen, UA: 0.2 mg/dL (ref 0.0–1.0)
pH: 7 (ref 5.0–8.0)

## 2013-11-14 LAB — WET PREP, GENITAL
Clue Cells Wet Prep HPF POC: NONE SEEN
Trich, Wet Prep: NONE SEEN
Yeast Wet Prep HPF POC: NONE SEEN

## 2013-11-14 NOTE — MAU Note (Signed)
Patient presents with complaint of vaginal pain and pressure X 3 days.

## 2013-11-14 NOTE — MAU Provider Note (Signed)
History   27yo, G3P1011 at [redacted]w[redacted]d presents with vaginal pain and pressure x 3 days.  Denies cramping, UCs, VB, recent fever, resp or GI c/o's, UTI s/s.    Chief Complaint  Patient presents with  . Vaginal Pain    Vaginal Pressure   HPI  OB History   Grav Para Term Preterm Abortions TAB SAB Ect Mult Living   3 1 1  1  1   1       Past Medical History  Diagnosis Date  . Urinary tract infection   . Kidney stone   . Abnormal Pap smear     during preg  . Chlamydia     Past Surgical History  Procedure Laterality Date  . Cesarean section    . Cesarean section    . No past surgeries      Family History  Problem Relation Age of Onset  . Anesthesia problems Neg Hx   . Alcohol abuse Neg Hx     History  Substance Use Topics  . Smoking status: Never Smoker   . Smokeless tobacco: Never Used  . Alcohol Use: No     Comment: rarely    Allergies: No Known Allergies  Prescriptions prior to admission  Medication Sig Dispense Refill  . acetaminophen (TYLENOL) 500 MG tablet Take 1,000 mg by mouth every 6 (six) hours as needed for pain (headache).      . docusate sodium (COLACE) 100 MG capsule Take 100 mg by mouth daily as needed for mild constipation.      . Prenatal Vit-Min-FA-Fish Oil ([employer] PRENATAL GUMMY PO) Take 2 each by mouth daily.        ROS ROS: see HPI above, all other systems are negative  Physical Exam   Blood pressure 111/61, pulse 75, temperature 98.3 F (36.8 C), temperature source Oral, resp. rate 16, height 5' 5.5" (1.664 m), weight 215 lb 2 oz (97.58 kg), last menstrual period 07/08/2013.  Physical Exam Chest: Clear Heart: RRR Abdomen: gravid, NT Extremities: WNL  SVE: 0% / High Effacement (%): Thick Exam by:: Epsie Walthall  FHT: Doppler 154 UCs: None  Results for orders placed during the hospital encounter of 11/14/13 (from the past 24 hour(s))  URINALYSIS, ROUTINE W REFLEX MICROSCOPIC     Status: None   Collection Time    11/14/13 10:32 AM   Result Value Range   Color, Urine YELLOW  YELLOW   APPearance CLEAR  CLEAR   Specific Gravity, Urine 1.015  1.005 - 1.030   pH 7.0  5.0 - 8.0   Glucose, UA NEGATIVE  NEGATIVE mg/dL   Hgb urine dipstick NEGATIVE  NEGATIVE   Bilirubin Urine NEGATIVE  NEGATIVE   Ketones, ur NEGATIVE  NEGATIVE mg/dL   Protein, ur NEGATIVE  NEGATIVE mg/dL   Urobilinogen, UA 0.2  0.0 - 1.0 mg/dL   Nitrite NEGATIVE  NEGATIVE   Leukocytes, UA NEGATIVE  NEGATIVE  WET PREP, GENITAL     Status: Abnormal   Collection Time    11/14/13  2:10 PM      Result Value Range   Yeast Wet Prep HPF POC NONE SEEN  NONE SEEN   Trich, Wet Prep NONE SEEN  NONE SEEN   Clue Cells Wet Prep HPF POC NONE SEEN  NONE SEEN   WBC, Wet Prep HPF POC FEW (*) NONE SEEN    ED Course  IUP at [redacted]w[redacted]d Vaginal pressure  Reassurance given Offered comfort measures   D/c home with precautions F/u 11/26 for  scheduled ROB, encouraged to come to the office sooner if needed.    Haroldine Laws CNM, MSN 11/14/2013 2:16 PM

## 2013-12-07 ENCOUNTER — Ambulatory Visit: Payer: BC Managed Care – PPO | Attending: Gynecologic Oncology | Admitting: Gynecologic Oncology

## 2013-12-07 ENCOUNTER — Encounter: Payer: Self-pay | Admitting: Gynecologic Oncology

## 2013-12-07 VITALS — BP 128/85 | HR 93 | Temp 98.7°F | Resp 16 | Ht 65.59 in | Wt 213.6 lb

## 2013-12-07 DIAGNOSIS — D069 Carcinoma in situ of cervix, unspecified: Secondary | ICD-10-CM

## 2013-12-07 DIAGNOSIS — N879 Dysplasia of cervix uteri, unspecified: Secondary | ICD-10-CM | POA: Insufficient documentation

## 2013-12-07 DIAGNOSIS — O3440 Maternal care for other abnormalities of cervix, unspecified trimester: Secondary | ICD-10-CM | POA: Insufficient documentation

## 2013-12-07 NOTE — Patient Instructions (Signed)
Plan to follow up on Feb. 18 or sooner if needed.  Please call for any questions or concerns.

## 2013-12-07 NOTE — Progress Notes (Signed)
Consult Note: Gyn-Onc  Leeanne Rio 27 y.o. female  CC:  Chief Complaint  Patient presents with  . CINIII    New consult    HPI: Patient is seen today in consultation at the request of Dr. Jaymes Graff for cervical dysplasia complicating pregnancy.  Patient is a 27 year old gravida 3 para 1 at 83 weeks ([facility] 04/14/2014). She does have a history of abnormal Pap smears in the past which normalized and did not require any therapy. Her last Pap smear was at the time of her annual visit last year. During this pregnancy she had a Pap smear that revealed high-grade dysplasia. She was seen by Dr. Normand Sloop and underwent a colposcopic biopsy on 11/03/2013. 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 She is referred to our clinic for evaluation of this.  She's overall doing well. She had good fetal movement and she's not had any bleeding or contractions. She is expecting a little girl by the name of "[name]". She has a son who is 67 years old. She did have a prior cesarean section as her son was "too big". Weight 8 lbs. 12 oz. She is a planned VBAC. She denies use tobacco or alcohol during this pregnancy. She's taking her prenatal vitamins.   Current Meds:  Outpatient Encounter Prescriptions as of 12/07/2013  Medication Sig  . docusate sodium (COLACE) 100 MG capsule Take 100 mg by mouth daily as needed for mild constipation.  . Prenatal Vit-Min-FA-Fish Oil ([employer] PRENATAL GUMMY PO) Take 2 each by mouth daily.  Marland Kitchen acetaminophen (TYLENOL) 500 MG tablet Take 1,000 mg by mouth every 6 (six) hours as needed for pain (headache).    Allergy: No Known Allergies  Social Hx:   History   Social History  . Marital Status: Single    Spouse Name: N/A    Number of Children: N/A  . Years of Education: N/A   Occupational History  . Not on file.   Social History Main Topics  . Smoking status: Never Smoker   . Smokeless tobacco: Never Used  . Alcohol Use:  0.6 oz/week    1 Glasses of wine per week     Comment: rarely  . Drug Use: No  . Sexual Activity: Yes    Birth Control/ Protection: None   Other Topics Concern  . Not on file   Social History Narrative   ** Merged History Encounter **        Past Surgical Hx:  Past Surgical History  Procedure Laterality Date  . Cesarean section    . Cesarean section    . No past surgeries      Past Medical Hx:  Past Medical History  Diagnosis Date  . Urinary tract infection   . Kidney stone   . Abnormal Pap smear     during preg  . Chlamydia     Oncology Hx:   No history exists.    Family Hx:  Family History  Problem Relation Age of Onset  . Anesthesia problems Neg Hx   . Alcohol abuse Neg Hx     Vitals:  Blood pressure 128/85, pulse 93, temperature 98.7 F (37.1 C), temperature source Oral, resp. rate 16, height 5' 5.59" (1.666 m), weight 213 lb 9.6 oz (96.888 kg), last menstrual period 07/08/2013.  Physical Exam: Well-nourished well-developed female in no acute distress.  Pelvic: Normal external female genitalia. Speculum exam was performed without difficulty. After obtaining the patient's verbal consent acetic acid  was applied the cervix. Colposcopy was performed. Colposcopy was difficult secondary to the position of the cervix as well as redundant vaginal walls. With the use of large Q-tips we were to move the vaginal walls to allow for colposcopy. Colposcopy was inadequate. There is a lesion noted on the anterior lip of the cervix. There is no evidence of cancer. Bimanual examination reveals a palpably normal cervix. There are no firm areas.  Assessment/Plan:  27 year old and 21 weeks with high-grade dysplasia of the cervix. I spoke to the patient and her mother regarding the findings and that we would need to proceed with treatment dysplasia postpartum. However, if she continues to have just dysplasia during the pregnancy this would not preclude a vaginal delivery and she  would only require a cesarean section electively as a repeat or for obstetrical indications. She will return to see me on February 18 for repeat colposcopy and will follow up with her other physicians as scheduled. Missi Mcmackin A., MD 12/07/2013, 2:49 PM

## 2013-12-08 ENCOUNTER — Ambulatory Visit: Payer: BC Managed Care – PPO | Admitting: Gynecologic Oncology

## 2013-12-29 ENCOUNTER — Encounter (HOSPITAL_COMMUNITY): Payer: Self-pay | Admitting: *Deleted

## 2013-12-29 ENCOUNTER — Inpatient Hospital Stay (HOSPITAL_COMMUNITY)
Admission: AD | Admit: 2013-12-29 | Discharge: 2013-12-29 | Disposition: A | Discharge status: Home / Self Care | Payer: BC Managed Care – PPO | Source: Ambulatory Visit | Attending: Obstetrics and Gynecology | Admitting: Obstetrics and Gynecology

## 2013-12-29 DIAGNOSIS — O228X9 Other venous complications in pregnancy, unspecified trimester: Secondary | ICD-10-CM | POA: Insufficient documentation

## 2013-12-29 DIAGNOSIS — K649 Unspecified hemorrhoids: Secondary | ICD-10-CM | POA: Insufficient documentation

## 2013-12-29 DIAGNOSIS — O2242 Hemorrhoids in pregnancy, second trimester: Secondary | ICD-10-CM

## 2013-12-29 MED ORDER — HYDROCORTISONE ACETATE 25 MG RE SUPP: 25.0000 mg | Freq: Two times a day (BID) | RECTAL | Status: DC

## 2013-12-29 NOTE — MAU Provider Note (Signed)
Chief Complaint:  Hemorrhoids      HPI: [name] is a 27 y.o. G3P1011 at [redacted]w[redacted]d who presents with hemorrhoids and associated rectal bleeding for the first time today. She noted bright red blood after a bowel movement earlier today and again later had slight bloody staining. Does not manually reduce the  hemorrhoids. Experienced rectal itching and irritation last week, but denies pain or pruritus now. Denies constipation or straining. Has treated  with Tucks only and takes Colace to prevent constipation.  Her hemorrhoids with first pregnancy resolved spontaneously. Denies contractions, leakage of fluid or vaginal bleeding. Good fetal movement.   Pregnancy Course: Essentially uncomplicated  Past Medical History: Past Medical History  Diagnosis Date  . Urinary tract infection   . Kidney stone   . Abnormal Pap smear     during preg  . Chlamydia     Past obstetric history: OB History  Gravida Para Term Preterm AB SAB TAB Ectopic Multiple Living  3 1 1  1 1    1     # Outcome Date GA Lbr Len/2nd Weight Sex Delivery Anes PTL Lv  3 CUR           2 SAB 10/31/11          1 TRM 12/23/02   3.969 kg (8 lb 12 oz) M LTCS  N Y     Comments: pushed- pt states 23hr      Past Surgical History: Past Surgical History  Procedure Laterality Date  . Cesarean section    . Cesarean section    . No past surgeries       Family History: Family History  Problem Relation Age of Onset  . Anesthesia problems Neg Hx   . Alcohol abuse Neg Hx     Social History: History  Substance Use Topics  . Smoking status: Never Smoker   . Smokeless tobacco: Never Used  . Alcohol Use: 0.6 oz/week    1 Glasses of wine per week     Comment: rarely    Allergies: No Known Allergies  Meds:  Prescriptions prior to admission  Medication Sig Dispense Refill  . acetaminophen (TYLENOL) 500 MG tablet Take 1,000 mg by mouth every 6 (six) hours as needed for pain (headache).      . docusate sodium (COLACE)  100 MG capsule Take 100 mg by mouth daily as needed for mild constipation.      . Prenatal Vit-Min-FA-Fish Oil ([employer] PRENATAL GUMMY PO) Take 2 each by mouth daily.        ROS: Pertinent findings in history of present illness.  Physical Exam  Blood pressure 102/54, pulse 69, temperature 98.9 F (37.2 C), temperature source Oral, resp. rate 16, height 5' 4.5" (1.638 m), weight 98.612 kg (217 lb 6.4 oz), last menstrual period 07/08/2013, SpO2 99.00%. GENERAL: Well-developed, well-nourished female in no acute distress.  HEENT: normocephalic HEART: normal rate RESP: normal effort ABDOMEN: Soft, non-tender, gravid appropriate for gestational age EXTREMITIES: Nontender, no edema NEURO: alert and oriented RECTAL: small 1 cm external soft fleshy hemorrhoidal tags, no evident thrombus, nontender; no internal hemorrhoids palpated, good sphincter tone; no blood noted   FHT:  Baseline 150 , moderate variability, accelerations present, occasional mild variable decelerations> reassuring for GA Contractions: none    Assessment: 1. Hemorrhoids in pregnancy, second trimester   G3P1011 at [redacted]w[redacted]d  Plan: Discharge home AVS hemorrhoids, recommend Sitz baths, high fiber diet     Medication List  acetaminophen 500 MG tablet  Commonly known as:  TYLENOL  Take 1,000 mg by mouth every 6 (six) hours as needed for pain (headache).     COLACE 100 MG capsule  Generic drug:  docusate sodium  Take 100 mg by mouth daily as needed for mild constipation.     CVS PRENATAL GUMMY PO  Take 2 each by mouth daily.     hydrocortisone 25 MG suppository  Commonly known as:  ANUSOL-HC  Place 1 suppository (25 mg total) rectally 2 (two) times daily.       Follow-up Information   Follow up with [facility] On 01/17/2014.   Specialty:  Obstetrics and Gynecology   Contact information:   [address] [place] [address] [phone]     Danae Orleans,  CNM 12/29/2013 7:48 PM

## 2013-12-29 NOTE — MAU Note (Signed)
Patient states she has had hemorrhoids for about 2 weeks and has had bleeding from the hemorrhoids for 2 days. Denies any pregnancy problems, no pain, bleeding or leaking. Reports fetal movement.

## 2014-02-04 ENCOUNTER — Inpatient Hospital Stay (HOSPITAL_COMMUNITY)
Admission: AD | Admit: 2014-02-04 | Discharge: 2014-02-05 | Disposition: A | Discharge status: Home / Self Care | Payer: BC Managed Care – PPO | Source: Ambulatory Visit | Attending: Obstetrics and Gynecology | Admitting: Obstetrics and Gynecology

## 2014-02-04 DIAGNOSIS — R079 Chest pain, unspecified: Secondary | ICD-10-CM | POA: Insufficient documentation

## 2014-02-04 DIAGNOSIS — O99891 Other specified diseases and conditions complicating pregnancy: Secondary | ICD-10-CM | POA: Insufficient documentation

## 2014-02-04 DIAGNOSIS — O9989 Other specified diseases and conditions complicating pregnancy, childbirth and the puerperium: Principal | ICD-10-CM

## 2014-02-04 DIAGNOSIS — R109 Unspecified abdominal pain: Secondary | ICD-10-CM | POA: Insufficient documentation

## 2014-02-04 NOTE — MAU Note (Signed)
Patient complains of upper left quadrant pain. Pain is worse with movement and tender to touch.

## 2014-02-05 ENCOUNTER — Encounter (HOSPITAL_COMMUNITY): Payer: Self-pay | Admitting: *Deleted

## 2014-02-05 LAB — URINALYSIS, ROUTINE W REFLEX MICROSCOPIC
Bilirubin Urine: NEGATIVE
Glucose, UA: NEGATIVE mg/dL
Ketones, ur: NEGATIVE mg/dL
Nitrite: NEGATIVE
Protein, ur: NEGATIVE mg/dL
Specific Gravity, Urine: 1.02 (ref 1.005–1.030)
Urobilinogen, UA: 0.2 mg/dL (ref 0.0–1.0)
pH: 6.5 (ref 5.0–8.0)

## 2014-02-05 LAB — URINE MICROSCOPIC-ADD ON

## 2014-02-05 NOTE — MAU Provider Note (Signed)
History   28 yo, G3P1011 at 3031w2d presents with left side pain in the ribs area starting 2 days ago and getting progressively worse.  Pain is increased with movement and activity but is not affected by eating.  No known injury can be recalled by pt. Pt has tried narcotic with good results. Denies N/V, VB, UCs, LOF, recent fever, resp or GI c/o's, UTI or PIH s/s. GFM.   Chief Complaint  Patient presents with  . Abdominal Pain   HPI  OB History   Grav Para Term Preterm Abortions TAB SAB Ect Mult Living   3 1 1  1  1   1       Past Medical History  Diagnosis Date  . Urinary tract infection   . Kidney stone   . Abnormal Pap smear     during preg  . Chlamydia     Past Surgical History  Procedure Laterality Date  . Cesarean section    . Cesarean section    . No past surgeries      Family History  Problem Relation Age of Onset  . Anesthesia problems Neg Hx   . Alcohol abuse Neg Hx     History  Substance Use Topics  . Smoking status: Never Smoker   . Smokeless tobacco: Never Used  . Alcohol Use: 0.6 oz/week    1 Glasses of wine per week     Comment: rarely    Allergies: No Known Allergies  Prescriptions prior to admission  Medication Sig Dispense Refill  . acetaminophen (TYLENOL) 500 MG tablet Take 1,000 mg by mouth every 6 (six) hours as needed for pain (headache).      Marland Kitchen. acetaminophen-codeine (TYLENOL #3) 300-30 MG per tablet Take by mouth every 4 (four) hours as needed for moderate pain.      Marland Kitchen. docusate sodium (COLACE) 100 MG capsule Take 100 mg by mouth daily as needed for mild constipation.      . hydrocortisone (ANUSOL-HC) 25 MG suppository Place 1 suppository (25 mg total) rectally 2 (two) times daily.  12 suppository  0  . Prenatal Vit-Min-FA-Fish Oil (CVS PRENATAL GUMMY PO) Take 2 each by mouth daily.        ROS ROS: see HPI above, all other systems are negative  Physical Exam   Blood pressure 98/51, pulse 90, temperature 98.5 F (36.9 C), temperature  source Oral, resp. rate 18, height 5\' 5"  (1.651 m), weight 220 lb (99.791 kg), last menstrual period 07/08/2013, SpO2 98.00%.  Physical Exam Chest: Clear Heart: RRR Abdomen: gravid, NT Extremities: WNL  FHT: Reactive NST UCs: None   ED Course  IUP at 3031w2d Rib pain  C/w Dr. Charlotta Newtonzan Alternate hot and cold packs D/c home with precautions F/u on 2/10 with an already scheduled ROB in the office  Haroldine LawsOXLEY, Navie Lamoreaux CNM, MSN 02/05/2014 12:25 AM

## 2014-02-05 NOTE — Discharge Instructions (Signed)
Third Trimester of Pregnancy °The third trimester is from week 29 through week 42, months 7 through 9. This trimester is when your unborn baby (fetus) is growing very fast. At the end of the ninth month, the unborn baby is about 20 inches in length. It weighs about 6 10 pounds.  °HOME CARE  °· Avoid all smoking, herbs, and alcohol. Avoid drugs not approved by your doctor. °· Only take medicine as told by your doctor. Some medicines are safe and some are not during pregnancy. °· Exercise only as told by your doctor. Stop exercising if you start having cramps. °· Eat regular, healthy meals. °· Wear a good support bra if your breasts are tender. °· Do not use hot tubs, steam rooms, or saunas. °· Wear your seat belt when driving. °· Avoid raw meat, uncooked cheese, and liter boxes and soil used by cats. °· Take your prenatal vitamins. °· Try taking medicine that helps you poop (stool softener) as needed, and if your doctor approves. Eat more fiber by eating fresh fruit, vegetables, and whole grains. Drink enough fluids to keep your pee (urine) clear or pale yellow. °· Take warm water baths (sitz baths) to soothe pain or discomfort caused by hemorrhoids. Use hemorrhoid cream if your doctor approves. °· If you have puffy, bulging veins (varicose veins), wear support hose. Raise (elevate) your feet for 15 minutes, 3 4 times a day. Limit salt in your diet. °· Avoid heavy lifting, wear low heels, and sit up straight. °· Rest with your legs raised if you have leg cramps or low back pain. °· Visit your dentist if you have not gone during your pregnancy. Use a soft toothbrush to brush your teeth. Be gentle when you floss. °· You can have sex (intercourse) unless your doctor tells you not to. °· Do not travel far distances unless you must. Only do so with your doctor's approval. °· Take prenatal classes. °· Practice driving to the hospital. °· Pack your hospital bag. °· Prepare the baby's room. °· Go to your doctor visits. °GET  HELP IF: °· You are not sure if you are in labor or if your water has broken. °· You are dizzy. °· You have mild cramps or pressure in your lower belly (abdominal). °· You have a nagging pain in your belly area. °· You continue to feel sick to your stomach (nauseous), throw up (vomit), or have watery poop (diarrhea). °· You have bad smelling fluid coming from your vagina. °· You have pain with peeing (urination). °GET HELP RIGHT AWAY IF:  °· You have a fever. °· You are leaking fluid from your vagina. °· You are spotting or bleeding from your vagina. °· You have severe belly cramping or pain. °· You lose or gain weight rapidly. °· You have trouble catching your breath and have chest pain. °· You notice sudden or extreme puffiness (swelling) of your face, hands, ankles, feet, or legs. °· You have not felt the baby move in over an hour. °· You have severe headaches that do not go away with medicine. °· You have vision changes. °Document Released: 03/12/2010 Document Revised: 04/12/2013 Document Reviewed: 02/16/2013 °ExitCare® Patient Information ©2014 [employer]. ° °

## 2014-02-16 ENCOUNTER — Ambulatory Visit: Payer: BC Managed Care – PPO | Admitting: Gynecologic Oncology

## 2014-03-09 ENCOUNTER — Other Ambulatory Visit: Payer: Self-pay | Admitting: Obstetrics and Gynecology

## 2014-03-10 ENCOUNTER — Ambulatory Visit: Payer: BC Managed Care – PPO | Attending: Gynecologic Oncology | Admitting: Gynecologic Oncology

## 2014-03-10 ENCOUNTER — Telehealth: Payer: Self-pay | Admitting: *Deleted

## 2014-03-10 NOTE — Telephone Encounter (Signed)
Called pt to reschedule appointment , pt was originally scheduled for today at 3:45 but was moved to 3:00. Pt did not arrive until 3:47. Dr. Duard BradyGehrig stated that patient could not be seen because she was late. Pt was offered an appointment tomorrow but refused because she wanted to see a female. Pt was also offered a appointment on March 18th to see Dr. Duard BradyGehrig but she declined due to her work schedule. Dr Duard BradyGehrig notified of the situation and stated that the patient should follow up after she gives birth. Pt was scheduled to see Dr. Duard BradyGehrig On May 14th. Pt agreed with this date. Ms Judie PetitMoravian was also told if she has  any issues or changed her mind concerning an appointment on March 18th please call the office at [phone].

## 2014-03-28 ENCOUNTER — Encounter (HOSPITAL_COMMUNITY): Payer: Self-pay | Admitting: Pharmacist

## 2014-04-06 ENCOUNTER — Encounter (HOSPITAL_COMMUNITY): Payer: Self-pay

## 2014-04-06 ENCOUNTER — Encounter (HOSPITAL_COMMUNITY)
Admission: RE | Admit: 2014-04-06 | Discharge: 2014-04-06 | Disposition: A | Discharge status: Home / Self Care | Payer: BC Managed Care – PPO | Source: Ambulatory Visit | Attending: Obstetrics and Gynecology | Admitting: Obstetrics and Gynecology

## 2014-04-06 ENCOUNTER — Encounter (HOSPITAL_COMMUNITY): Payer: Self-pay | Admitting: Obstetrics and Gynecology

## 2014-04-06 DIAGNOSIS — F411 Generalized anxiety disorder: Secondary | ICD-10-CM | POA: Diagnosis not present

## 2014-04-06 DIAGNOSIS — Z98891 History of uterine scar from previous surgery: Secondary | ICD-10-CM

## 2014-04-06 DIAGNOSIS — Z87442 Personal history of urinary calculi: Secondary | ICD-10-CM | POA: Diagnosis not present

## 2014-04-06 DIAGNOSIS — D649 Anemia, unspecified: Secondary | ICD-10-CM | POA: Diagnosis present

## 2014-04-06 LAB — CBC
HCT: 29.8 % — ABNORMAL LOW (ref 36.0–46.0)
Hemoglobin: 9.7 g/dL — ABNORMAL LOW (ref 12.0–15.0)
MCH: 27.2 pg (ref 26.0–34.0)
MCHC: 32.6 g/dL (ref 30.0–36.0)
MCV: 83.5 fL (ref 78.0–100.0)
Platelets: 322 10*3/uL (ref 150–400)
RBC: 3.57 MIL/uL — ABNORMAL LOW (ref 3.87–5.11)
RDW: 14.2 % (ref 11.5–15.5)
WBC: 10.2 10*3/uL (ref 4.0–10.5)

## 2014-04-06 LAB — RPR

## 2014-04-06 LAB — TYPE AND SCREEN
ABO/RH(D): O POS
Antibody Screen: NEGATIVE

## 2014-04-06 NOTE — H&P (Signed)
Leeanne RioJamil D Custer is a 28 y.o. female, G3P1011 at 7439 weeks, presenting for scheduled repeat cesarean delivery.  Denies leaking or bleeding, reports +FM.  Patient Active Problem List   Diagnosis Date Noted  . Anemia--Hgb 9/7 04/06/14 04/06/2014  . Anxiety state, unspecified 04/06/2014  . History of kidney stones 04/06/2014  . Hx of cesarean section--FTD 04/06/2014  . 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  Needs colpo pp. 12/07/2013   History of present pregnancy: Patient entered care at 11 6/7 weeks, as transfer from [facility].   EDC of 04/14/14 was established by LMP.   Anatomy scan:  19 1/7 weeks, with limited anatomy and an posterior placenta. 23 weeks--f/u anatomy WNL 30 5/7 weeks--EFW 93%ile, 4+8, normal fluid.   Significant prenatal events:  Colpo at 16 6/7 weeks, with biopsy--plan for colpo pp.  Had been seen at gyn oncology prior to transfer to [facility].  Anxiety dx during pregnancy, referred to [facility]--improved during pregnancy.  Declined flu vaccine and TDaP.  Bile salts drawn during pregnancy due to itching. Treated for toothache at 26 weeks with pain med.  Anemia noted, with Hgb 9.7 on 04/06/14.  Elected to schedule repeat C/S.     Last evaluation:  03/31/14--cervix closed.  OB History   Grav Para Term Preterm Abortions TAB SAB Ect Mult Living   3 1 1  1  1   1     2003--Primary LTCS, due to FTD, 23 hour labor, female, 8+12 2012--SAB  Past Medical History  Diagnosis Date  . Urinary tract infection   . Abnormal Pap smear     during preg  . Chlamydia   . Kidney stone    Past Surgical History  Procedure Laterality Date  . Cesarean section    . Wisdom tooth extraction  2012   Family History: family history is negative for Anesthesia problems and Alcohol abuse.  Social History:  reports that she has never smoked. She has never used smokeless tobacco. She reports that she drinks alcohol. She reports that she does not use illicit drugs.  Patient is single, employed in [employer].   Prenatal Transfer Tool  Maternal Diabetes: No Genetic Screening: Normal Quad screen Maternal Ultrasounds/Referrals: Normal Fetal Ultrasounds or other Referrals:  None Maternal Substance Abuse:  No Significant Maternal Medications:  None Significant Maternal Lab Results: Lab values include: Group B Strep negative    ROS:  +FM  No Known Allergies     Last menstrual period 07/08/2013.  Chest clear Heart RRR without murmur Abd gravid, NT, FH 39 cm Pelvic: deferred Ext: WNL  FHR: 150 at last visit UCs: Occasional per patient  Prenatal labs: ABO, Rh: --/--/O POS (04/08 1121) Antibody: NEG (04/08 1121) Rubella:   Immune RPR: NON REAC (04/08 1115)  HBsAg: NEGATIVE (08/27 1636)  HIV: NON REACTIVE (08/27 1636)  GBS:  Negative Sickle cell/Hgb electrophoresis:  AA Pap:  HGSIL[facility]CIN 2 and [facility].  Colpo at [facility] 16 weeks, with bx. GC:  Negative at [facility] with [facility] Chlamydia:  Negative at [facility] with [facility] Genetic screenings:  Quad screen WNl Glucola:  Elevated 1 hour, normal 3 hour Other:  Varicella immune; Hgb 10.1 01/11/14, 9.7 on 04/06/14 Normal bile salts at 36 weeks; Vit D low at 13 from initial PN labs.   Assessment/Plan: IUP at 39 weeks Previous cesarean delivery, desires repeat Anxiety Hx HGSIL/CIN 2&3  [facility] consult with Dr. Normand Sloopillard Routine [facility] pre-op orders  Shelly BombardVicki LathamCNM, [place] 04/06/2014, 9:17 PM

## 2014-04-06 NOTE — Patient Instructions (Signed)
18 [name]  04/06/2014   Your procedure is scheduled on:  04/07/14  Enter through the Main Entrance of [facility] at 8 AM.  Pick up the phone at the desk and dial [phone].   Call this number if you have problems the morning of surgery: [phone]   Remember:   Do not eat food:After Midnight.  Do not drink clear liquids: After Midnight.  Take these medicines the morning of surgery with A SIP OF WATER: NA   Do not wear jewelry, make-up or nail polish.  Do not wear lotions, powders, or perfumes. You may wear deodorant.  Do not shave 48 hours prior to surgery.  Do not bring valuables to the hospital.  [facility] is not   responsible for any belongings or valuables brought to the hospital.  Contacts, dentures or bridgework may not be worn into surgery.  Leave suitcase in the car. After surgery it may be brought to your room.  For patients admitted to the hospital, checkout time is 11:00 AM the day of              discharge.   Patients discharged the day of surgery will not be allowed to drive             home.  Name and phone number of your driver: Na  Special Instructions:      Please read over the following fact sheets that you were given:   Surgical Site Infection Prevention

## 2014-04-07 ENCOUNTER — Encounter (HOSPITAL_COMMUNITY): Payer: BC Managed Care – PPO | Admitting: Anesthesiology

## 2014-04-07 ENCOUNTER — Encounter (HOSPITAL_COMMUNITY): Payer: Self-pay | Admitting: Anesthesiology

## 2014-04-07 ENCOUNTER — Inpatient Hospital Stay (HOSPITAL_COMMUNITY): Payer: BC Managed Care – PPO | Admitting: Anesthesiology

## 2014-04-07 ENCOUNTER — Inpatient Hospital Stay (HOSPITAL_COMMUNITY)
Admission: RE | Admit: 2014-04-07 | Discharge: 2014-04-09 | DRG: 766 | Disposition: A | Discharge status: Home / Self Care | Payer: BC Managed Care – PPO | Source: Ambulatory Visit | Attending: Obstetrics and Gynecology | Admitting: Obstetrics and Gynecology

## 2014-04-07 ENCOUNTER — Encounter (HOSPITAL_COMMUNITY)
Admission: RE | Disposition: A | Discharge status: Home / Self Care | Payer: Self-pay | Source: Ambulatory Visit | Attending: Obstetrics and Gynecology

## 2014-04-07 DIAGNOSIS — O34219 Maternal care for unspecified type scar from previous cesarean delivery: Secondary | ICD-10-CM | POA: Diagnosis present

## 2014-04-07 DIAGNOSIS — Z87442 Personal history of urinary calculi: Secondary | ICD-10-CM | POA: Diagnosis not present

## 2014-04-07 DIAGNOSIS — Z98891 History of uterine scar from previous surgery: Secondary | ICD-10-CM

## 2014-04-07 DIAGNOSIS — Z87410 Personal history of cervical dysplasia: Secondary | ICD-10-CM

## 2014-04-07 DIAGNOSIS — D649 Anemia, unspecified: Secondary | ICD-10-CM | POA: Diagnosis present

## 2014-04-07 DIAGNOSIS — O9902 Anemia complicating childbirth: Principal | ICD-10-CM | POA: Diagnosis present

## 2014-04-07 DIAGNOSIS — F411 Generalized anxiety disorder: Secondary | ICD-10-CM | POA: Diagnosis not present

## 2014-04-07 DIAGNOSIS — O99344 Other mental disorders complicating childbirth: Secondary | ICD-10-CM | POA: Diagnosis present

## 2014-04-07 LAB — CBC
HCT: 24.9 % — ABNORMAL LOW (ref 36.0–46.0)
Hemoglobin: 8.1 g/dL — ABNORMAL LOW (ref 12.0–15.0)
MCH: 27.2 pg (ref 26.0–34.0)
MCHC: 32.5 g/dL (ref 30.0–36.0)
MCV: 83.6 fL (ref 78.0–100.0)
Platelets: 269 10*3/uL (ref 150–400)
RBC: 2.98 MIL/uL — ABNORMAL LOW (ref 3.87–5.11)
RDW: 14.2 % (ref 11.5–15.5)
WBC: 12.2 10*3/uL — ABNORMAL HIGH (ref 4.0–10.5)

## 2014-04-07 LAB — COMPREHENSIVE METABOLIC PANEL
ALT: 12 U/L (ref 0–35)
AST: 18 U/L (ref 0–37)
Albumin: 2.1 g/dL — ABNORMAL LOW (ref 3.5–5.2)
Alkaline Phosphatase: 133 U/L — ABNORMAL HIGH (ref 39–117)
BUN: 7 mg/dL (ref 6–23)
CO2: 23 mEq/L (ref 19–32)
Calcium: 8.5 mg/dL (ref 8.4–10.5)
Chloride: 100 mEq/L (ref 96–112)
Creatinine, Ser: 0.58 mg/dL (ref 0.50–1.10)
GFR calc Af Amer: >90 mL/min (ref >90)
GFR calc non Af Amer: >90 mL/min (ref >90)
Glucose, Bld: 117 mg/dL — ABNORMAL HIGH (ref 70–99)
Potassium: 3.9 mEq/L (ref 3.7–5.3)
Sodium: 134 mEq/L — ABNORMAL LOW (ref 137–147)
Total Bilirubin: 0.4 mg/dL (ref 0.3–1.2)
Total Protein: 5.2 g/dL — ABNORMAL LOW (ref 6.0–8.3)

## 2014-04-07 SURGERY — Surgical Case
Anesthesia: Spinal

## 2014-04-07 MED ORDER — BUPIVACAINE IN DEXTROSE 0.75-8.25 % IT SOLN
INTRATHECAL | Status: DC | PRN
  Administered 2014-04-07: 1.8 mL via INTRATHECAL

## 2014-04-07 MED ORDER — KETOROLAC TROMETHAMINE 30 MG/ML IJ SOLN: 30.0000 mg | Freq: Four times a day (QID) | INTRAMUSCULAR | Status: DC | PRN

## 2014-04-07 MED ORDER — OXYCODONE-ACETAMINOPHEN 5-325 MG PO TABS
1.0000 | ORAL_TABLET | ORAL | Status: DC | PRN
  Administered 2014-04-07 – 2014-04-08 (×3): 1 via ORAL
  Administered 2014-04-08 (×3): 2 via ORAL
  Administered 2014-04-08: 1 via ORAL
  Administered 2014-04-09 (×2): 2 via ORAL
  Filled 2014-04-07 (×3): qty 2
  Filled 2014-04-07 (×2): qty 1
  Filled 2014-04-07 (×2): qty 2
  Filled 2014-04-07: qty 1
  Filled 2014-04-07: qty 2

## 2014-04-07 MED ORDER — LACTATED RINGERS IV SOLN
INTRAVENOUS | Status: DC | PRN
  Administered 2014-04-07: 10:00:00 via INTRAVENOUS

## 2014-04-07 MED ORDER — LACTATED RINGERS IV SOLN
Freq: Once | INTRAVENOUS | Status: AC
  Administered 2014-04-07: 08:00:00 via INTRAVENOUS

## 2014-04-07 MED ORDER — MEPERIDINE HCL 25 MG/ML IJ SOLN: 6.2500 mg | INTRAMUSCULAR | Status: DC | PRN

## 2014-04-07 MED ORDER — LANOLIN HYDROUS EX OINT: 1.0000 "application " | TOPICAL_OINTMENT | CUTANEOUS | Status: DC | PRN

## 2014-04-07 MED ORDER — SIMETHICONE 80 MG PO CHEW: 80.0000 mg | CHEWABLE_TABLET | ORAL | Status: DC | PRN

## 2014-04-07 MED ORDER — ONDANSETRON HCL 4 MG/2ML IJ SOLN: 4.0000 mg | INTRAMUSCULAR | Status: DC | PRN

## 2014-04-07 MED ORDER — SENNOSIDES-DOCUSATE SODIUM 8.6-50 MG PO TABS
2.0000 | ORAL_TABLET | ORAL | Status: DC
Start: 2014-04-08 — End: 2014-04-09
  Administered 2014-04-07 – 2014-04-08 (×2): 2 via ORAL
  Filled 2014-04-07 (×2): qty 2

## 2014-04-07 MED ORDER — DIBUCAINE 1 % RE OINT: 1.0000 "application " | TOPICAL_OINTMENT | RECTAL | Status: DC | PRN

## 2014-04-07 MED ORDER — WITCH HAZEL-GLYCERIN EX PADS: 1.0000 "application " | MEDICATED_PAD | CUTANEOUS | Status: DC | PRN

## 2014-04-07 MED ORDER — MORPHINE SULFATE (PF) 0.5 MG/ML IJ SOLN
INTRAMUSCULAR | Status: DC | PRN
  Administered 2014-04-07: .2 mg via INTRATHECAL

## 2014-04-07 MED ORDER — OXYTOCIN 10 UNIT/ML IJ SOLN
40.0000 [IU] | INTRAMUSCULAR | Status: DC | PRN
  Administered 2014-04-07: 40 [IU] via INTRAVENOUS

## 2014-04-07 MED ORDER — FENTANYL CITRATE 0.05 MG/ML IJ SOLN: 25.0000 ug | INTRAMUSCULAR | Status: DC | PRN

## 2014-04-07 MED ORDER — OXYTOCIN 40 UNITS IN LACTATED RINGERS INFUSION - SIMPLE MED: 62.5000 mL/h | INTRAVENOUS | Status: AC

## 2014-04-07 MED ORDER — FENTANYL CITRATE 0.05 MG/ML IJ SOLN
INTRAMUSCULAR | Status: DC | PRN
  Administered 2014-04-07: 25 ug via INTRATHECAL

## 2014-04-07 MED ORDER — DIPHENHYDRAMINE HCL 50 MG/ML IJ SOLN: 12.5000 mg | INTRAMUSCULAR | Status: DC | PRN

## 2014-04-07 MED ORDER — NALBUPHINE HCL 10 MG/ML IJ SOLN
5.0000 mg | INTRAMUSCULAR | Status: DC | PRN
  Administered 2014-04-07: 10 mg via SUBCUTANEOUS

## 2014-04-07 MED ORDER — MEASLES, MUMPS & RUBELLA VAC ~~LOC~~ INJ
0.5000 mL | INJECTION | Freq: Once | SUBCUTANEOUS | Status: DC
  Filled 2014-04-07: qty 0.5

## 2014-04-07 MED ORDER — MORPHINE SULFATE 0.5 MG/ML IJ SOLN
INTRAMUSCULAR | Status: AC
  Filled 2014-04-07: qty 10

## 2014-04-07 MED ORDER — DIPHENHYDRAMINE HCL 50 MG/ML IJ SOLN: 25.0000 mg | INTRAMUSCULAR | Status: DC | PRN

## 2014-04-07 MED ORDER — KETOROLAC TROMETHAMINE 30 MG/ML IJ SOLN
30.0000 mg | Freq: Four times a day (QID) | INTRAMUSCULAR | Status: DC | PRN
  Administered 2014-04-07: 30 mg via INTRAMUSCULAR

## 2014-04-07 MED ORDER — LACTATED RINGERS IV SOLN
INTRAVENOUS | Status: DC
  Administered 2014-04-07 (×2): via INTRAVENOUS

## 2014-04-07 MED ORDER — NALBUPHINE HCL 10 MG/ML IJ SOLN
INTRAMUSCULAR | Status: AC
  Filled 2014-04-07: qty 1

## 2014-04-07 MED ORDER — PRENATAL MULTIVITAMIN CH
1.0000 | ORAL_TABLET | Freq: Every day | ORAL | Status: DC
  Administered 2014-04-08: 1 via ORAL
  Filled 2014-04-07: qty 1

## 2014-04-07 MED ORDER — MENTHOL 3 MG MT LOZG: 1.0000 | LOZENGE | OROMUCOSAL | Status: DC | PRN

## 2014-04-07 MED ORDER — NALBUPHINE HCL 10 MG/ML IJ SOLN: 5.0000 mg | INTRAMUSCULAR | Status: DC | PRN

## 2014-04-07 MED ORDER — OXYTOCIN 10 UNIT/ML IJ SOLN
INTRAMUSCULAR | Status: AC
  Filled 2014-04-07: qty 4

## 2014-04-07 MED ORDER — ONDANSETRON HCL 4 MG/2ML IJ SOLN
INTRAMUSCULAR | Status: DC | PRN
  Administered 2014-04-07: 4 mg via INTRAVENOUS

## 2014-04-07 MED ORDER — ZOLPIDEM TARTRATE 5 MG PO TABS: 5.0000 mg | ORAL_TABLET | Freq: Every evening | ORAL | Status: DC | PRN

## 2014-04-07 MED ORDER — KETOROLAC TROMETHAMINE 30 MG/ML IJ SOLN
INTRAMUSCULAR | Status: AC
Start: 2014-04-07 — End: 2014-04-07
  Administered 2014-04-07: 30 mg via INTRAMUSCULAR
  Filled 2014-04-07: qty 1

## 2014-04-07 MED ORDER — SCOPOLAMINE 1 MG/3DAYS TD PT72
1.0000 | MEDICATED_PATCH | Freq: Once | TRANSDERMAL | Status: DC
  Filled 2014-04-07: qty 1

## 2014-04-07 MED ORDER — FUROSEMIDE 10 MG/ML IJ SOLN
10.0000 mg | Freq: Once | INTRAMUSCULAR | Status: AC
  Administered 2014-04-07: 10 mg via INTRAVENOUS
  Filled 2014-04-07: qty 1

## 2014-04-07 MED ORDER — LACTATED RINGERS IV SOLN
INTRAVENOUS | Status: DC
  Administered 2014-04-07 (×2): via INTRAVENOUS

## 2014-04-07 MED ORDER — PROMETHAZINE HCL 25 MG/ML IJ SOLN
6.2500 mg | INTRAMUSCULAR | Status: DC | PRN
Start: 2014-04-07 — End: 2014-04-07

## 2014-04-07 MED ORDER — DIPHENHYDRAMINE HCL 25 MG PO CAPS
25.0000 mg | ORAL_CAPSULE | Freq: Four times a day (QID) | ORAL | Status: DC | PRN
  Administered 2014-04-08: 25 mg via ORAL
  Filled 2014-04-07: qty 1

## 2014-04-07 MED ORDER — NALOXONE HCL 1 MG/ML IJ SOLN
1.0000 ug/kg/h | INTRAVENOUS | Status: DC | PRN
  Filled 2014-04-07: qty 2

## 2014-04-07 MED ORDER — CEFAZOLIN SODIUM-DEXTROSE 2-3 GM-% IV SOLR
2.0000 g | INTRAVENOUS | Status: AC
  Administered 2014-04-07: 2 g via INTRAVENOUS

## 2014-04-07 MED ORDER — ONDANSETRON HCL 4 MG/2ML IJ SOLN: 4.0000 mg | Freq: Three times a day (TID) | INTRAMUSCULAR | Status: DC | PRN

## 2014-04-07 MED ORDER — IBUPROFEN 600 MG PO TABS
600.0000 mg | ORAL_TABLET | Freq: Four times a day (QID) | ORAL | Status: DC
  Administered 2014-04-07 – 2014-04-09 (×7): 600 mg via ORAL
  Filled 2014-04-07 (×7): qty 1

## 2014-04-07 MED ORDER — SCOPOLAMINE 1 MG/3DAYS TD PT72
MEDICATED_PATCH | TRANSDERMAL | Status: AC
  Administered 2014-04-07: 1.5 mg via TRANSDERMAL
  Filled 2014-04-07: qty 1

## 2014-04-07 MED ORDER — SIMETHICONE 80 MG PO CHEW
80.0000 mg | CHEWABLE_TABLET | ORAL | Status: DC
  Administered 2014-04-07 – 2014-04-08 (×2): 80 mg via ORAL
  Filled 2014-04-07 (×2): qty 1

## 2014-04-07 MED ORDER — ONDANSETRON HCL 4 MG PO TABS: 4.0000 mg | ORAL_TABLET | ORAL | Status: DC | PRN

## 2014-04-07 MED ORDER — PHENYLEPHRINE 8 MG IN D5W 100 ML (0.08MG/ML) PREMIX OPTIME
INJECTION | INTRAVENOUS | Status: DC | PRN
  Administered 2014-04-07: 60 ug/min via INTRAVENOUS

## 2014-04-07 MED ORDER — DIPHENHYDRAMINE HCL 50 MG/ML IJ SOLN
INTRAMUSCULAR | Status: DC | PRN
  Administered 2014-04-07: 25 mg via INTRAVENOUS

## 2014-04-07 MED ORDER — FENTANYL CITRATE 0.05 MG/ML IJ SOLN
INTRAMUSCULAR | Status: AC
  Filled 2014-04-07: qty 2

## 2014-04-07 MED ORDER — FLEET ENEMA 7-19 GM/118ML RE ENEM: 1.0000 | ENEMA | Freq: Every day | RECTAL | Status: DC | PRN

## 2014-04-07 MED ORDER — NALOXONE HCL 0.4 MG/ML IJ SOLN: 0.4000 mg | INTRAMUSCULAR | Status: DC | PRN

## 2014-04-07 MED ORDER — DIPHENHYDRAMINE HCL 50 MG/ML IJ SOLN
INTRAMUSCULAR | Status: AC
  Filled 2014-04-07: qty 1

## 2014-04-07 MED ORDER — PHENYLEPHRINE 8 MG IN D5W 100 ML (0.08MG/ML) PREMIX OPTIME
INJECTION | INTRAVENOUS | Status: AC
  Filled 2014-04-07: qty 100

## 2014-04-07 MED ORDER — SCOPOLAMINE 1 MG/3DAYS TD PT72
1.0000 | MEDICATED_PATCH | Freq: Once | TRANSDERMAL | Status: DC
  Administered 2014-04-07: 1.5 mg via TRANSDERMAL

## 2014-04-07 MED ORDER — TETANUS-DIPHTH-ACELL PERTUSSIS 5-2.5-18.5 LF-MCG/0.5 IM SUSP: 0.5000 mL | Freq: Once | INTRAMUSCULAR | Status: DC

## 2014-04-07 MED ORDER — CEFAZOLIN SODIUM-DEXTROSE 2-3 GM-% IV SOLR
INTRAVENOUS | Status: AC
  Filled 2014-04-07: qty 50

## 2014-04-07 MED ORDER — ONDANSETRON HCL 4 MG/2ML IJ SOLN
INTRAMUSCULAR | Status: AC
  Filled 2014-04-07: qty 2

## 2014-04-07 MED ORDER — BISACODYL 10 MG RE SUPP: 10.0000 mg | Freq: Every day | RECTAL | Status: DC | PRN

## 2014-04-07 MED ORDER — LACTATED RINGERS IV BOLUS (SEPSIS)
500.0000 mL | Freq: Once | INTRAVENOUS | Status: AC
  Administered 2014-04-07: 500 mL via INTRAVENOUS

## 2014-04-07 MED ORDER — FERROUS SULFATE 325 (65 FE) MG PO TABS
325.0000 mg | ORAL_TABLET | Freq: Two times a day (BID) | ORAL | Status: DC
  Administered 2014-04-07 – 2014-04-09 (×4): 325 mg via ORAL
  Filled 2014-04-07 (×4): qty 1

## 2014-04-07 MED ORDER — METOCLOPRAMIDE HCL 5 MG/ML IJ SOLN: 10.0000 mg | Freq: Three times a day (TID) | INTRAMUSCULAR | Status: DC | PRN

## 2014-04-07 MED ORDER — DIPHENHYDRAMINE HCL 25 MG PO CAPS
25.0000 mg | ORAL_CAPSULE | ORAL | Status: DC | PRN
  Administered 2014-04-08: 25 mg via ORAL
  Filled 2014-04-07: qty 1

## 2014-04-07 MED ORDER — SIMETHICONE 80 MG PO CHEW
80.0000 mg | CHEWABLE_TABLET | Freq: Three times a day (TID) | ORAL | Status: DC
  Administered 2014-04-07 – 2014-04-09 (×5): 80 mg via ORAL
  Filled 2014-04-07 (×5): qty 1

## 2014-04-07 MED ORDER — SODIUM CHLORIDE 0.9 % IJ SOLN: 3.0000 mL | INTRAMUSCULAR | Status: DC | PRN

## 2014-04-07 SURGICAL SUPPLY — 44 items
APL SKNCLS STERI-STRIP NONHPOA (GAUZE/BANDAGES/DRESSINGS) ×1
BENZOIN TINCTURE PRP APPL 2/3 (GAUZE/BANDAGES/DRESSINGS) ×3 IMPLANT
CLAMP CORD UMBIL (MISCELLANEOUS) IMPLANT
CLOSURE WOUND 1/2 X4 (GAUZE/BANDAGES/DRESSINGS) ×1
CLOTH BEACON ORANGE TIMEOUT ST (SAFETY) ×3 IMPLANT
CONTAINER PREFILL 10% NBF 15ML (MISCELLANEOUS) IMPLANT
DRAIN JACKSON PRT FLT 10 (DRAIN) IMPLANT
DRAPE LG THREE QUARTER DISP (DRAPES) IMPLANT
DRSG OPSITE POSTOP 4X10 (GAUZE/BANDAGES/DRESSINGS) ×3 IMPLANT
DRSG TELFA 3X8 NADH (GAUZE/BANDAGES/DRESSINGS) ×3 IMPLANT
DURAPREP 26ML APPLICATOR (WOUND CARE) ×3 IMPLANT
ELECT REM PT RETURN 9FT ADLT (ELECTROSURGICAL) ×3
ELECTRODE REM PT RTRN 9FT ADLT (ELECTROSURGICAL) ×1 IMPLANT
EVACUATOR SILICONE 100CC (DRAIN) IMPLANT
EXTRACTOR VACUUM M CUP 4 TUBE (SUCTIONS) IMPLANT
EXTRACTOR VACUUM M CUP 4' TUBE (SUCTIONS)
GLOVE BIO SURGEON STRL SZ 6.5 (GLOVE) ×2 IMPLANT
GLOVE BIO SURGEONS STRL SZ 6.5 (GLOVE) ×1
GLOVE BIOGEL PI IND STRL 7.0 (GLOVE) ×1 IMPLANT
GLOVE BIOGEL PI INDICATOR 7.0 (GLOVE) ×2
GOWN STRL REUS W/TWL LRG LVL3 (GOWN DISPOSABLE) ×6 IMPLANT
HEMOSTAT SURGICEL 2X3 (HEMOSTASIS) ×2 IMPLANT
KIT ABG SYR 3ML LUER SLIP (SYRINGE) IMPLANT
NDL HYPO 25X5/8 SAFETYGLIDE (NEEDLE) IMPLANT
NEEDLE HYPO 25X5/8 SAFETYGLIDE (NEEDLE) IMPLANT
NS IRRIG 1000ML POUR BTL (IV SOLUTION) ×3 IMPLANT
PACK C SECTION WH (CUSTOM PROCEDURE TRAY) ×3 IMPLANT
PAD ABD 7.5X8 STRL (GAUZE/BANDAGES/DRESSINGS) ×2 IMPLANT
PAD DRESSING TELFA 3X8 NADH (GAUZE/BANDAGES/DRESSINGS) IMPLANT
PAD OB MATERNITY 4.3X12.25 (PERSONAL CARE ITEMS) ×3 IMPLANT
RTRCTR C-SECT PINK 25CM LRG (MISCELLANEOUS) IMPLANT
STRIP CLOSURE SKIN 1/2X4 (GAUZE/BANDAGES/DRESSINGS) ×2 IMPLANT
SUT CHROMIC 0 CT 1 (SUTURE) ×3 IMPLANT
SUT MNCRL AB 3-0 PS2 27 (SUTURE) ×3 IMPLANT
SUT PLAIN 2 0 (SUTURE) ×6
SUT PLAIN 2 0 XLH (SUTURE) ×3 IMPLANT
SUT PLAIN ABS 2-0 CT1 27XMFL (SUTURE) ×2 IMPLANT
SUT SILK 2 0 SH (SUTURE) IMPLANT
SUT VIC AB 0 CTX 36 (SUTURE) ×12
SUT VIC AB 0 CTX36XBRD ANBCTRL (SUTURE) ×4 IMPLANT
SUT VICRYL 0 TIES 12 18 (SUTURE) ×2 IMPLANT
TOWEL OR 17X24 6PK STRL BLUE (TOWEL DISPOSABLE) ×3 IMPLANT
TRAY FOLEY CATH 14FR (SET/KITS/TRAYS/PACK) ×3 IMPLANT
WATER STERILE IRR 1000ML POUR (IV SOLUTION) ×3 IMPLANT

## 2014-04-07 NOTE — Anesthesia Postprocedure Evaluation (Addendum)
  Anesthesia Post-op Note  Patient: [name]  Procedure(s) Performed: Procedure(s): CESAREAN SECTION (N/A)  Patient Location: PACU  Anesthesia Type:Spinal  Level of Consciousness: awake, alert  and oriented  Airway and Oxygen Therapy: Patient Spontanous Breathing  Post-op Pain: none  Post-op Assessment: Post-op Vital signs reviewed, Patient's Cardiovascular Status Stable, Respiratory Function Stable, Patent Airway, No signs of Nausea or vomiting and Pain level controlled  Post-op Vital Signs: Reviewed and stable  Last Vitals:  Filed Vitals:   04/07/14 1325  BP: 112/74  Pulse: 64  Temp: 36.3 C  Resp: 20    Complications: No apparent anesthesia complications

## 2014-04-07 NOTE — Transfer of Care (Signed)
Immediate Anesthesia Transfer of Care Note  Patient: [name]  Procedure(s) Performed: Procedure(s): CESAREAN SECTION (N/A)  Patient Location: PACU  Anesthesia Type:Spinal  Level of Consciousness: awake, alert  and oriented  Airway & Oxygen Therapy: Patient Spontanous Breathing  Post-op Assessment: Report given to PACU RN and Post -op Vital signs reviewed and stable  Post vital signs: Reviewed and stable  Complications: No apparent anesthesia complications

## 2014-04-07 NOTE — Progress Notes (Signed)
On assessment pt was rating her abdominal pain a 5 out of 10 and increasing. Pt received Toradol 30 mg IM in PACU at 1134.  Called Dr Maple HudsonMoser, Anesthesiologists, stated that if patient was alert and oriented that it would be okay to administer the Percocet 5/325mg  by mouth.  Pt urine output was also 50ml in 2 hrs, spoke to Auto-Owners InsuranceVickie Latham, CNM, ordered LR 500ml over 30 min and call back with any changes.

## 2014-04-07 NOTE — Lactation Note (Signed)
This note was copied from the chart of Karen LuxembourgJamil Ingram. Lactation Consultation Note Initial visit at 9 hours of age. Mom reports breastfeeding is hurting and she was wanting a bottle.  Discussed exclusively breast feeding and benefits with mom and visitors.  Hand expression yields colostrum easily.  Left nipple flattens with compression, right nipple is semi flat.  Assisted in football hold on right breast.  Baby latches well with wide flanged lips and suckling bursts.  Mom denies pain with this latch.  Encouraged mom to call for assist as needed for latch.  Encouraged mom to use EBM on nipples if she has pain.  Baby fed for about 5 minutes and then pulled off and fell asleep at the breast STS.  Mom feels more encouraged to continue breast feeding here, but plans to give formula later.  [facility] resources given and discussed.  [facility].  Patient Name: [name] Today's Date: 04/07/2014 Reason for consult: Initial assessment   Maternal Data    Feeding Feeding Type: Breast Fed Length of feed: 5 min  LATCH Score/Interventions Latch: Grasps breast easily, tongue down, lips flanged, rhythmical sucking. Intervention(s): Breast compression;Assist with latch  Audible Swallowing: A few with stimulation Intervention(s): Skin to skin;Hand expression  Type of Nipple: Flat Intervention(s): Reverse pressure;No intervention needed  Comfort (Breast/Nipple): Filling, red/small blisters or bruises, mild/mod discomfort  Problem noted: Mild/Moderate discomfort  Hold (Positioning): No assistance needed to correctly position infant at breast. Intervention(s): Breastfeeding basics reviewed;Support Pillows;Position options;Skin to skin  LATCH Score: 7  Lactation Tools Discussed/Used     Consult Status Consult Status: Follow-up Date: 04/08/14 Follow-up type: In-patient    [name] 04/07/2014, 8:02 PM

## 2014-04-07 NOTE — Anesthesia Preprocedure Evaluation (Signed)
Anesthesia Evaluation  Patient identified by MRN, date of birth, ID band Patient awake    Reviewed: Allergy & Precautions, H&P , NPO status , Patient's Chart, lab work & pertinent test results  History of Anesthesia Complications Negative for: history of anesthetic complications  Airway Mallampati: II TM Distance: >3 FB Neck ROM: Full    Dental  (+) Teeth Intact   Pulmonary neg pulmonary ROS,  breath sounds clear to auscultation        Cardiovascular negative cardio ROS  Rhythm:Regular     Neuro/Psych negative neurological ROS  negative psych ROS   GI/Hepatic negative GI ROS, Neg liver ROS,   Endo/Other  negative endocrine ROS  Renal/GU History of kidney stones     Musculoskeletal negative musculoskeletal ROS (+)   Abdominal   Peds  Hematology  (+) anemia ,   Anesthesia Other Findings   Reproductive/Obstetrics (+) Pregnancy                           Anesthesia Physical Anesthesia Plan  ASA: II  Anesthesia Plan: Spinal   Post-op Pain Management:    Induction:   Airway Management Planned: Natural Airway  Additional Equipment: None  Intra-op Plan:   Post-operative Plan:   Informed Consent: I have reviewed the patients History and Physical, chart, labs and discussed the procedure including the risks, benefits and alternatives for the proposed anesthesia with the patient or authorized representative who has indicated his/her understanding and acceptance.   Dental advisory given  Plan Discussed with: CRNA and Surgeon  Anesthesia Plan Comments:         Anesthesia Quick Evaluation

## 2014-04-07 NOTE — H&P (Signed)
Date of Initial H&P: 4/8  History reviewed, patient examined, no change in status, stable for surgery.

## 2014-04-07 NOTE — Progress Notes (Signed)
Called regarding post-op urinary output of 50 cc in 2 1/2 hours. 300 cc output documented in OR, 400 cc in Recovery. Urine appears concentrated--no evidence of blood.  Plan: IV fluid bolus Monitor output in next 2 hours.  Nigel BridgemanVicki Jonus Coble, CNM 04/07/14 1445

## 2014-04-07 NOTE — Anesthesia Procedure Notes (Signed)
Spinal  Patient location during procedure: OB Start time: 04/07/2014 9:30 AM End time: 04/07/2014 9:34 AM Staffing Anesthesiologist: Adynn Caseres, CHRIS Performed by: anesthesiologist  Preanesthetic Checklist Completed: patient identified, surgical consent, pre-op evaluation, timeout performed, IV checked, risks and benefits discussed and monitors and equipment checked Spinal Block Patient position: sitting Prep: site prepped and draped and DuraPrep Patient monitoring: heart rate, cardiac monitor, continuous pulse ox and blood pressure Approach: midline Location: L3-4 Injection technique: single-shot Needle Needle type: Pencan  Needle gauge: 24 G Needle length: 10 cm Assessment Sensory level: T4

## 2014-04-07 NOTE — Addendum Note (Signed)
Addendum created 04/07/14 1715 by Orlie Pollenebra R Shakerria Parran, CRNA   Modules edited: Notes Section   Notes Section:  File: [id number]

## 2014-04-07 NOTE — Consult Note (Signed)
Neonatology Note:  Attendance at C-section:  I was asked by Dr. Dillard to attend this repeat C/S at term. The mother is a G3P1A1 O pos, GBS neg with an uncomplicated pregnancy. ROM at delivery, fluid clear. Infant vigorous with good spontaneous cry and tone. Needed no suctioning. Ap 9/10. Lungs clear to ausc in DR. To CN to care of Pediatrician.  Karen Largo C. Eason Housman, MD  

## 2014-04-07 NOTE — Anesthesia Postprocedure Evaluation (Signed)
  Anesthesia Post-op Note  Patient: [name]  Procedure(s) Performed: Procedure(s): CESAREAN SECTION (N/A)  Patient Location: PACU and Mother/Baby  Anesthesia Type:Spinal  Level of Consciousness: awake, alert , oriented and patient cooperative  Airway and Oxygen Therapy: Patient Spontanous Breathing  Post-op Pain: none  Post-op Assessment: Post-op Vital signs reviewed, Patient's Cardiovascular Status Stable and Respiratory Function Stable  Post-op Vital Signs: Reviewed and stable  Last Vitals:  Filed Vitals:   04/07/14 1530  BP: 102/67  Pulse: 76  Temp: 36.6 C  Resp: 20    Complications: No apparent anesthesia complications

## 2014-04-07 NOTE — Progress Notes (Signed)
Called with update on status of output. 50 cc since approx 3pm--urine still concentrated, seems to be clearing slightly in the tube. No bladder distension per RN evaluation.  Filed Vitals:   04/07/14 1325 04/07/14 1425 04/07/14 1530 04/07/14 1747  BP: 112/74 110/73 102/67 112/71  Pulse: 64 69 76 72  Temp: 97.3 F (36.3 C) 97.4 F (36.3 C) 97.8 F (36.6 C)   TempSrc: Oral Oral Oral   Resp: 20 18 20 20   Height:      Weight:      SpO2: 98% 99% 98% 99%   Consulted with Dr. Normand Sloopillard. Lasix 10 mg IV x 1 Irrigate foley CMP  Will CTO.  Nigel BridgemanVicki Iya Hamed, CNM 04/07/14 1810

## 2014-04-07 NOTE — Op Note (Signed)
Cesarean Section Procedure Note   Karen RioJamil D Silversmith  04/07/2014  Indications: Scheduled Proceedure/Maternal Request   Pre-operative Diagnosis: Prior Cesarean Section.   Post-operative Diagnosis: Same   Surgeon: Surgeon(s) and Role:    * Michael LitterNaima A Ameris Akamine, MD - Primary   Assistants: Manfred ArchV. Latham   Anesthesia: spinal   Procedure Details:  The patient was seen in the Holding Room. The risks, benefits, complications, treatment options, and expected outcomes were discussed with the patient. The patient concurred with the proposed plan, giving informed consent. identified as [name] and the procedure verified as C-Section Delivery. A Time Out was held and the above information confirmed.  After induction of anesthesia, the patient was draped and prepped in the usual sterile manner. A transverse incision was made and carried down through the subcutaneous tissue to the fascia. Fascial incision was made in the midline and extended transversely. The fascia was separated from the underlying rectus muscle superiorly and inferiorly. The peritoneum was identified and entered. Peritoneal incision was extended longitudinally with good visualization of bowel and bladder. The utero-vesical peritoneal reflection was incised transversely and the bladder flap was bluntly freed from the lower uterine segment.  An alexsis retractor was placed in the abdomen.   A low transverse uterine incision was made. Delivered from cephalic presentation was a  infant, with Apgar scores of 9 at one minute and 10 at five minutes. Cord ph was not sent the umbilical cord was clamped and cut cord blood was obtained for evaluation. The placenta was removed Intact and appeared normal. The uterine outline, tubes and ovaries appeared normal}. The uterine incision was closed with running locked sutures of 0Vicryl. A second layer 0 vicrlyl was used to imbricate the uterine incision    Hemostasis was observed. Lavage was carried out until  clear. The alexsis was removed.  The peritoneum was closed with 0 chromic.  The muscles were examined and any bleeders were made hemostatic using bovie cautery device.   The fascia was then reapproximated with running sutures of 0 vicryl.  The subcutaneous tissue was reapproximated  With interrupted stitches using 2-0 plain gut. The subcuticular closure was performed using 3-550monocryl     Instrument, sponge, and needle counts were correct prior the abdominal closure and were correct at the conclusion of the case.    Findings: infant was delivered from vtx presentation. The fluid was clear.  The uterus tubes and ovaries appeared normal.     Estimated Blood Loss: 900ml   Total IV Fluids: 2500ml   Urine Output: 200CC OF clear urine  Specimens: none  Complications: no complications  Disposition: PACU - hemodynamically stable.   Maternal Condition: stable   Baby condition / location:  Nursery  Attending Attestation: I performed the procedure.   Signed: Surgeon(s): Michael LitterNaima A Glendora Clouatre, MD

## 2014-04-08 ENCOUNTER — Encounter (HOSPITAL_COMMUNITY): Payer: Self-pay | Admitting: Obstetrics and Gynecology

## 2014-04-08 LAB — CBC
HCT: 25.4 % — ABNORMAL LOW (ref 36.0–46.0)
Hemoglobin: 8 g/dL — ABNORMAL LOW (ref 12.0–15.0)
MCH: 26.4 pg (ref 26.0–34.0)
MCHC: 31.5 g/dL (ref 30.0–36.0)
MCV: 83.8 fL (ref 78.0–100.0)
Platelets: 277 10*3/uL (ref 150–400)
RBC: 3.03 MIL/uL — ABNORMAL LOW (ref 3.87–5.11)
RDW: 14.4 % (ref 11.5–15.5)
WBC: 13 10*3/uL — ABNORMAL HIGH (ref 4.0–10.5)

## 2014-04-08 NOTE — Progress Notes (Signed)
Patient was referred for history of depression/anxiety.  * Referral screened out by Clinical Social Worker because none of the following criteria appear to apply:  ~ History of anxiety/depression during this pregnancy, or of post-partum depression.  ~ Diagnosis of anxiety and/or depression within last 3 years  ~ History of depression due to pregnancy loss/loss of child  OR  * Patient's symptoms currently being treated with medication and/or therapy.  Please contact the Clinical Social Worker if needs arise, or by the patient's request.  Anxiety symptoms were situational, per pt. She denies history of depression & report that she feels fine.

## 2014-04-08 NOTE — Progress Notes (Signed)
Subjective: Postpartum Day 1: Cesarean Delivery due to Scheduled Repeat Patient up ad lib, reports no syncope or dizziness. Patient reports flatulence and denies N/V and BM Reports minimal pain Feeding:  Breast/Bottle Contraceptive plan:  Unsure  Objective: Vital signs in last 24 hours: Temp:  [97.1 F (36.2 C)-98.2 F (36.8 C)] 98.2 F (36.8 C) (04/10 0911) Pulse Rate:  [60-84] 80 (04/10 0911) Resp:  [15-20] 18 (04/10 0911) BP: (98-114)/(48-77) 98/63 mmHg (04/10 0911) SpO2:  [96 %-99 %] 98 % (04/10 0911) Weight:  [220 lb (99.791 kg)] 220 lb (99.791 kg) (04/09 1300)  Physical Exam:  General: alert, cooperative and no distress Lochia: appropriate Uterine Fundus: firm Incision: Honeycomb dressing CDI DVT Evaluation: No evidence of DVT seen on physical exam. Negative Homan's sign. Calf/Ankle edema is present. JP drain:   N/A   Recent Labs  04/06/14 1115 04/07/14 1825 04/08/14 0620  HGB 9.7* 8.1* 8.0*  HCT 29.8* 24.9* 25.4*  WBC 10.2 12.2* 13.0*    Assessment S/P Cesarean section day 1. Stable  Plan: Continue present mgmt Desires d/c tomorrow   Joyice FasterJessica Lynn [facility] 04/08/2014, 11:18 AM

## 2014-04-09 MED ORDER — IBUPROFEN 600 MG PO TABS: 600.0000 mg | ORAL_TABLET | Freq: Four times a day (QID) | ORAL | Status: DC | PRN

## 2014-04-09 MED ORDER — OXYCODONE-ACETAMINOPHEN 5-325 MG PO TABS: 1.0000 | ORAL_TABLET | ORAL | Status: DC | PRN

## 2014-04-09 MED ORDER — FERROUS SULFATE 325 (65 FE) MG PO TABS: 325.0000 mg | ORAL_TABLET | Freq: Two times a day (BID) | ORAL | Status: DC

## 2014-04-09 NOTE — Discharge Summary (Signed)
Cesarean Section Delivery Discharge Summary  [name]  DOB:    Dec 21, 1986 MRN:    [id number] CSN:    [id number]  Date of admission:                  04/07/14  Date of discharge:                   04/09/14  Procedures this admission:  Repeat LTCS  Date of Delivery: 04/07/14  Newborn Data:  Live born female  Birth Weight: 8 lb 11.5 oz (3955 g) APGAR: 9, 10  Home with mother.   History of Present Illness:  Ms. [name] is a 28 y.o. female, [id number], who presents at [redacted]w[redacted]d weeks gestation. The patient has been followed at the [facility] and Gy[facility]cology division of [facility].    Her pregnancy has been complicated by:  Patient Active Problem List   Diagnosis Date Noted  . Cesarean delivery delivered--repeat 04/07/2014  . Anemia--Hgb 9/7 04/06/14 04/06/2014  . Anxiety state, unspecified 04/06/2014  . History of kidney stones 04/06/2014  . Hx of cesarean section--FTD 04/06/2014  . 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  Needs colpo pp. 12/07/2013    Hospital Course: Patient was admitted on 04/07/14 for a scheduled repeat cesarean delivery.   She was taken to the operating room, where Dr. Normand Sloop performed a repeat LTCS under spinal anesthesia, with delivery of a viable female with weight and Apgars as listed below. Infant was in good condition and remained at the patient's bedside.  The patient was taken to recovery in good condition.  Patient planned to breast and bottle feed.  On post-op day 1, patient was doing well, tolerating a regular diet, with Hgb of 8.0 (pre-delivery Hgb 8.1.  Throughout her stay, her physical exam was WNL, her incision was CDI, and her vital signs remained stable.  Orthostatics were stable, and the patient was started on Fe.  By post-op day 2, she was up ad lib, tolerating a regular diet, with good pain control with po med.  She was deemed to have received the full benefit of her hospital stay, and was discharged home in  stable condition.  Contraceptive choice was undecided at d/c--options reviewed.    Feeding:  breast  Contraception:  Undecided at present.  Discharge hemoglobin:  Hemoglobin  Date Value Ref Range Status  04/08/2014 8.0* 12.0 - 15.0 g/dL Final  05/02/6643 8.1* 03.4 - 15.0 g/dL Final  07/02/2594 9.7* 63.8 - 15.0 g/dL Final     HCT  Date Value Ref Range Status  04/08/2014 25.4* 36.0 - 46.0 % Final  04/07/2014 24.9* 36.0 - 46.0 % Final  04/06/2014 29.8* 36.0 - 46.0 % Final     WBC  Date Value Ref Range Status  04/08/2014 13.0* 4.0 - 10.5 K/uL Final  04/07/2014 12.2* 4.0 - 10.5 K/uL Final  04/06/2014 10.2  4.0 - 10.5 K/uL Final    Discharge Physical Exam:   General: alert Lochia: appropriate Uterine Fundus: firm Incision: Honeycomb dressing CDI DVT Evaluation: No evidence of DVT seen on physical exam. Negative Homan's sign.  Intrapartum Procedures: cesarean: low cervical, transverse Postpartum Procedures: none Complications-Operative and Postpartum: Anemia, without hemodynamic instability  Discharge Diagnoses: Term Pregnancy-delivered and anemia  Discharge Information:  Activity:           pelvic rest Diet:                routine Medications: Ibuprofen, Iron and Percocet  Condition:      stable Instructions:  Discharge to: home  NEEDS PP COLPO DUE TO ABNORMAL PAP DURING PREGNANCY.  Follow-up Information   [facility] In 5 weeks. (Call for any questions or concerns.)    Specialty:  Obstetrics and Gynecology   Contact information:   [address] [place] [id number] [phone]       Karen BridgemanVicki Ketty Ingram 04/09/2014

## 2014-04-09 NOTE — Discharge Instructions (Signed)
Postpartum Care After Cesarean Delivery After you deliver your newborn (postpartum period), the usual stay in the hospital is 24 72 hours. If there were problems with your labor or delivery, or if you have other medical problems, you might be in the hospital longer.  While you are in the hospital, you will receive help and instructions on how to care for yourself and your newborn during the postpartum period.  While you are in the hospital:  It is normal for you to have pain or discomfort from the incision in your abdomen. Be sure to tell your nurses when you are having pain, where the pain is located, and what makes the pain worse.  If you are breastfeeding, you may feel uncomfortable contractions of your uterus for a couple of weeks. This is normal. The contractions help your uterus get back to normal size.  It is normal to have some bleeding after delivery.  For the first 1 3 days after delivery, the flow is red and the amount may be similar to a period.  It is common for the flow to start and stop.  In the first few days, you may pass some small clots. Let your nurses know if you begin to pass large clots or your flow increases.  Do not  flush blood clots down the toilet before having the nurse look at them.  During the next 3 10 days after delivery, your flow should become more watery and pink or brown-tinged in color.  Ten to fourteen days after delivery, your flow should be a small amount of yellowish-white discharge.  The amount of your flow will decrease over the first few weeks after delivery. Your flow may stop in 6 8 weeks. Most women have had their flow stop by 12 weeks after delivery.  You should change your sanitary pads frequently.  Wash your hands thoroughly with soap and water for at least 20 seconds after changing pads, using the toilet, or before holding or feeding your newborn.  Your intravenous (IV) tubing will be removed when you are drinking enough  fluids.  The urine drainage tube (urinary catheter) that was inserted before delivery may be removed within 6 8 hours after delivery or when feeling returns to your legs. You should feel like you need to empty your bladder within the first 6 8 hours after the catheter has been removed.  In case you become weak, lightheaded, or faint, call your nurse before you get out of bed for the first time and before you take a shower for the first time.  Within the first few days after delivery, your breasts may begin to feel tender and full. This is called engorgement. Breast tenderness usually goes away within 48 72 hours after engorgement occurs. You may also notice milk leaking from your breasts. If you are not breastfeeding, do not stimulate your breasts. Breast stimulation can make your breasts produce more milk.  Spending as much time as possible with your newborn is very important. During this time, you and your newborn can feel close and get to know each other. Having your newborn stay in your room (rooming in) will help to strengthen the bond with your newborn. It will give you time to get to know your newborn and become comfortable caring for your newborn.  Your hormones change after delivery. Sometimes the hormone changes can temporarily cause you to feel sad or tearful. These feelings should not last more than a few days. If these feelings last longer  than that, you should talk to your caregiver.  If desired, talk to your caregiver about methods of family planning or contraception.  Talk to your caregiver about immunizations. Your caregiver may want you to have the following immunizations before leaving the hospital:  Tetanus, diphtheria, and pertussis (Tdap) or tetanus and diphtheria (Td) immunization. It is very important that you and your family (including grandparents) or others caring for your newborn are up-to-date with the Tdap or Td immunizations. The Tdap or Td immunization can help protect  your newborn from getting ill.  Rubella immunization.  Varicella (chickenpox) immunization.  Influenza immunization. You should receive this annual immunization if you did not receive the immunization during your pregnancy. Document Released: 09/09/2012 Document Reviewed: 09/09/2012 [facility] Patient Information 2014 [place], [place].   Anemia, Nonspecific Anemia is a condition in which the concentration of red blood cells or hemoglobin in the blood is below normal. Hemoglobin is a substance in red blood cells that carries oxygen to the tissues of the body. Anemia results in not enough oxygen reaching these tissues.  CAUSES  Common causes of anemia include:   Excessive bleeding. Bleeding may be internal or external. This includes excessive bleeding from periods (in women) or from the intestine.   Poor nutrition.   Chronic kidney, thyroid, and liver disease.  Bone marrow disorders that decrease red blood cell production.  Cancer and treatments for cancer.  HIV, AIDS, and their treatments.  Spleen problems that increase red blood cell destruction.  Blood disorders.  Excess destruction of red blood cells due to infection, medicines, and autoimmune disorders. SIGNS AND SYMPTOMS   Minor weakness.   Dizziness.   Headache.  Palpitations.   Shortness of breath, especially with exercise.   Paleness.  Cold sensitivity.  Indigestion.  Nausea.  Difficulty sleeping.  Difficulty concentrating. Symptoms may occur suddenly or they may develop slowly.  DIAGNOSIS  Additional blood tests are often needed. These help your health care provider determine the best treatment. Your health care provider will check your stool for blood and look for other causes of blood loss.  TREATMENT  Treatment varies depending on the cause of the anemia. Treatment can include:   Supplements of iron, vitamin B12, or folic acid.   Hormone medicines.   A blood transfusion. This may be  needed if blood loss is severe.   Hospitalization. This may be needed if there is significant continual blood loss.   Dietary changes.  Spleen removal. HOME CARE INSTRUCTIONS Keep all follow-up appointments. It often takes many weeks to correct anemia, and having your health care provider check on your condition and your response to treatment is very important. SEEK IMMEDIATE MEDICAL CARE IF:   You develop extreme weakness, shortness of breath, or chest pain.   You become dizzy or have trouble concentrating.  You develop heavy vaginal bleeding.   You develop a rash.   You have bloody or black, tarry stools.   You faint.   You vomit up blood.   You vomit repeatedly.   You have abdominal pain.  You have a fever or persistent symptoms for more than 2 3 days.   You have a fever and your symptoms suddenly get worse.   You are dehydrated.  MAKE SURE YOU:  Understand these instructions.  Will watch your condition.  Will get help right away if you are not doing well or get worse. Document Released: 01/23/2005 Document Revised: 08/18/2013 Document Reviewed: 06/11/2013 [facility] Patient Information 2014 [place], [place].

## 2014-05-12 ENCOUNTER — Ambulatory Visit: Payer: BC Managed Care – PPO | Attending: Gynecologic Oncology | Admitting: Gynecologic Oncology

## 2014-05-12 ENCOUNTER — Encounter: Payer: Self-pay | Admitting: Gynecologic Oncology

## 2014-05-12 ENCOUNTER — Other Ambulatory Visit (HOSPITAL_COMMUNITY)
Admission: RE | Admit: 2014-05-12 | Discharge: 2014-05-12 | Disposition: A | Discharge status: Home / Self Care | Payer: BC Managed Care – PPO | Source: Ambulatory Visit | Attending: Gynecologic Oncology | Admitting: Gynecologic Oncology

## 2014-05-12 VITALS — BP 121/82 | HR 48 | Temp 98.4°F | Resp 16 | Ht 65.35 in | Wt 196.8 lb

## 2014-05-12 DIAGNOSIS — N879 Dysplasia of cervix uteri, unspecified: Secondary | ICD-10-CM | POA: Diagnosis present

## 2014-05-12 DIAGNOSIS — O3440 Maternal care for other abnormalities of cervix, unspecified trimester: Secondary | ICD-10-CM | POA: Diagnosis not present

## 2014-05-12 DIAGNOSIS — Z01419 Encounter for gynecological examination (general) (routine) without abnormal findings: Secondary | ICD-10-CM | POA: Insufficient documentation

## 2014-05-12 DIAGNOSIS — Z87442 Personal history of urinary calculi: Secondary | ICD-10-CM | POA: Diagnosis not present

## 2014-05-12 DIAGNOSIS — Z79899 Other long term (current) drug therapy: Secondary | ICD-10-CM | POA: Diagnosis not present

## 2014-05-12 DIAGNOSIS — D069 Carcinoma in situ of cervix, unspecified: Secondary | ICD-10-CM

## 2014-05-12 NOTE — Patient Instructions (Signed)
Colposcopy, Care After  Refer to this sheet in the next few weeks. These instructions provide you with information on caring for yourself after your procedure. Your health care provider may also give you more specific instructions. Your treatment has been planned according to current medical practices, but problems sometimes occur. Call your health care provider if you have any problems or questions after your procedure.  WHAT TO EXPECT AFTER THE PROCEDURE   After your procedure, it is typical to have the following:  · Cramping. This often goes away in a few minutes.  · Soreness. This may last for 2 days.  · Lightheadedness. Lie down for a few minutes if this occurs.  You may also have some bleeding or dark discharge for a few days. You may need to wear a sanitary pad during this time.  HOME CARE INSTRUCTIONS  · Avoid sex, douching, and using tampons for 3 days or as directed by your health care provider.  · Only take over-the-counter or prescription medicines as directed by your health care provider. Do not take aspirin because it can cause bleeding.  · Continue to take birth control pills if you are on them.  · Not all test results are available during your visit. If your test results are not back during the visit, make an appointment with your health care provider to find out the results. Do not assume everything is normal if you have not heard from your health care provider or the medical facility. It is important for you to follow up on all of your test results.  · Follow your health care provider's advice regarding activity, follow-up visits, and follow-up Pap tests.  SEEK MEDICAL CARE IF:  · You develop a rash.  · You have problems with your medicine.  SEEK IMMEDIATE MEDICAL CARE IF:  · You are bleeding heavily or are passing blood clots.  · You have a fever.  · You have abnormal vaginal discharge.  · You are having cramps that do not go away after taking your pain medicine.  · You feel lightheaded, dizzy, or  faint.  · You have stomach pain.  Document Released: 10/06/2013 Document Reviewed: 07/15/2013  [employer]® Patient Information ©2014 [employer].

## 2014-05-12 NOTE — Progress Notes (Signed)
Consult Note: Gyn-Onc  [name] D Kiger 28 y.o. female  CC:  Chief Complaint  Patient presents with  . CIN    Follow up    HPI: Patient is seen today in consultation at the request of Dr. Jaymes GraffNaima Ingram for cervical dysplasia complicating pregnancy.  Patient is a 28 year old gravida 3 para 2 s/p repeat C/S 04/07/14. She does have a history of abnormal Pap smears in the past which normalized and did not require any therapy. Her last Pap smear was at the time of her annual visit last year. During this pregnancy she had a Pap smear that revealed high-grade dysplasia. She was seen by Dr. Normand Sloopillard and underwent a colposcopic biopsy on 11/03/2013. 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 She is referred to our clinic for evaluation of this.  She's overall doing well. She had good fetal movement and she's not had any bleeding or contractions. She is expecting a little girl by the name of "[name]". She has a son who is 30 years old. She did have a prior cesarean section as her son was "too big". Weight 8 lbs. 12 oz. She is a planned VBAC. She denies use tobacco or alcohol during this pregnancy.   I saw her December 2014 during her pregnancy for high-grade dysplasia. She was supposed to return for her interval colposcopic evaluations but did not. She comes in today for postpartum Pap smear and colposcopy. She had an elective repeat cesarean section of a little girl.   She's overall doing quite well. She follows up with her obstetrician's next week. She is bottle feeding. Her daughter's name is Horticulturist, commercialaige. She has not yet had a menstrual cycle.  Current Meds:  Outpatient Encounter Prescriptions as of 05/12/2014  Medication Sig  . ferrous sulfate 325 (65 FE) MG tablet Take 1 tablet (325 mg total) by mouth 2 (two) times daily with a meal.  . ibuprofen (ADVIL,MOTRIN) 600 MG tablet Take 1 tablet (600 mg total) by mouth every 6 (six) hours as needed.  . Prenatal  Vit-Min-FA-Fish Oil ([employer]S PRENATAL GUMMY PO) Take 2 each by mouth daily.  . hydrocortisone cream 1 % Apply 1 application topically as needed for itching.  . [DISCONTINUED] oxyCODONE-acetaminophen (PERCOCET/ROXICET) 5-325 MG per tablet Take 1-2 tablets by mouth every 4 (four) hours as needed for severe pain (moderate - severe pain).    Allergy: No Known Allergies  Social Hx:   History   Social History  . Marital Status: Single    Spouse Name: N/A    Number of Children: N/A  . Years of Education: N/A   Occupational History  . Not on file.   Social History Main Topics  . Smoking status: Never Smoker   . Smokeless tobacco: Never Used  . Alcohol Use: Yes     Comment: rarely  . Drug Use: No  . Sexual Activity: Yes   Other Topics Concern  . Not on file   Social History Narrative   ** Merged History Encounter **        Past Surgical Hx:  Past Surgical History  Procedure Laterality Date  . Cesarean section    . Wisdom tooth extraction  2012  . Cesarean section N/A 04/07/2014    Procedure: CESAREAN SECTION;  Surgeon: Michael LitterNaima A Dillard, MD;  Location: [facility];  Service: Obstetrics;  Laterality: N/A;    Past Medical Hx:  Past Medical History  Diagnosis Date  . Urinary tract infection   .  Abnormal Pap smear     during preg  . Chlamydia   . Kidney stone     Oncology Hx:   No history exists.    Family Hx:  Family History  Problem Relation Age of Onset  . Anesthesia problems Neg Hx   . Alcohol abuse Neg Hx     Vitals:  Blood pressure 121/82, pulse 48, temperature 98.4 F (36.9 C), temperature source Oral, resp. rate 16, height 5' 5.35" (1.66 m), weight 196 lb 12.8 oz (89.268 kg), unknown if currently breastfeeding.  Physical Exam: Well-nourished well-developed female in no acute distress.  Pelvic: Normal external female genitalia. Speculum exam was performed without difficulty. Cervix is without gross lesions. There's no bleeding. Pap smear was submitted without  difficulty. After obtaining the patient's verbal consent acetic acid was applied the cervix. Colposcopy was performed. Colposcopy was difficult secondary to the position of the cervix as well as redundant vaginal walls. Colposcopy was adequate. There is a lesion noted on the anterior lip of the cervix. At 12:00 as well as a small flame-type lesion and medially at 6:00 with a third lesion at 7 to 8:00 on the posterior lip of the cervix. After giving her verbal consent cervical biopsy at the 2:00 position was performed. Endocervical curettage was performed. Hemostasis was obtained using silver nitrate.  Assessment/Plan:  28 year old and 21 weeks with high-grade dysplasia of the cervix. We will call her with the results of the biopsy from today and determine her disposition pending them. I believe she'll most likely either need a LEEP versus a cold left conization. [name] A. Karen BradyGehrig, MD 05/12/2014, 10:34 AM

## 2014-05-17 ENCOUNTER — Telehealth: Payer: Self-pay | Admitting: *Deleted

## 2014-05-17 NOTE — Telephone Encounter (Signed)
Per Dr. Duard BradyGehrig, called Dr. Orvan Julyillards office,  Unable to reach MD's nurse. LMOVM for RN to call our office regarding pt.

## 2014-05-17 NOTE — Telephone Encounter (Signed)
Spoke with Bonita QuinLinda at Dr. Orvan Julyillards office gave me's instructions pt will need Leep, this can be done in office with Dr. Normand Sloopillard or we can schedule pt to have procedure in OR. Faxed Bonita QuinLinda copy of results. Will wait for Dr. Orvan Julyillards Office reply

## 2014-05-17 NOTE — Telephone Encounter (Signed)
error 

## 2014-05-20 ENCOUNTER — Telehealth: Payer: Self-pay | Admitting: *Deleted

## 2014-05-20 NOTE — Telephone Encounter (Signed)
Called Dr. Orvan July office, spoke with surgery scheduler. Pt will be set up for Leep procedure at their office. No appt for surgery scheduled at this time.

## 2014-05-26 ENCOUNTER — Telehealth: Payer: Self-pay | Admitting: *Deleted

## 2014-05-26 NOTE — Telephone Encounter (Signed)
Pt called request LEEP, Sheduled pt for procedure jun2, pt unable to get a pre-op appt as they are booked, June 9 pt declined as she has to return to work. Pt requested procedure for after Aug 19. Pt advised she will call us back when she knows what day she can come.

## 2014-06-07 ENCOUNTER — Ambulatory Visit (HOSPITAL_COMMUNITY): Admit: 2014-06-07 | Payer: BC Managed Care – PPO | Admitting: Gynecologic Oncology

## 2014-06-07 ENCOUNTER — Encounter (HOSPITAL_COMMUNITY): Payer: Self-pay

## 2014-06-07 SURGERY — LEEP (LOOP ELECTROSURGICAL EXCISION PROCEDURE)
Anesthesia: Choice

## 2014-07-13 ENCOUNTER — Telehealth: Payer: Self-pay | Admitting: *Deleted

## 2014-07-13 NOTE — Telephone Encounter (Signed)
Called pt to discuss Leep procedure.Pt advised she does not have any time off as she recently had a baby. Pt available to bee seen by Dr. Duard BradyGehrig 8/26 to discuss surgery at [facility] on 9/16. Pt verbalized and confirmed dates. No further concerns at this time.

## 2014-08-22 ENCOUNTER — Telehealth: Payer: Self-pay | Admitting: *Deleted

## 2014-08-22 NOTE — Telephone Encounter (Signed)
Pt confirmed appt 8/26 and plan for surgery 9/16

## 2014-08-24 ENCOUNTER — Other Ambulatory Visit (HOSPITAL_COMMUNITY)
Admission: RE | Admit: 2014-08-24 | Discharge: 2014-08-24 | Disposition: A | Discharge status: Home / Self Care | Payer: BC Managed Care – PPO | Source: Ambulatory Visit | Attending: Gynecologic Oncology | Admitting: Gynecologic Oncology

## 2014-08-24 ENCOUNTER — Encounter: Payer: Self-pay | Admitting: Gynecologic Oncology

## 2014-08-24 ENCOUNTER — Ambulatory Visit: Payer: BC Managed Care – PPO | Attending: Gynecologic Oncology | Admitting: Gynecologic Oncology

## 2014-08-24 VITALS — BP 111/62 | HR 72 | Temp 98.7°F | Resp 20 | Ht 65.35 in | Wt 203.1 lb

## 2014-08-24 DIAGNOSIS — D072 Carcinoma in situ of vagina: Secondary | ICD-10-CM

## 2014-08-24 DIAGNOSIS — D069 Carcinoma in situ of cervix, unspecified: Secondary | ICD-10-CM | POA: Diagnosis present

## 2014-08-24 DIAGNOSIS — Z01419 Encounter for gynecological examination (general) (routine) without abnormal findings: Secondary | ICD-10-CM | POA: Insufficient documentation

## 2014-08-24 DIAGNOSIS — N871 Moderate cervical dysplasia: Secondary | ICD-10-CM | POA: Insufficient documentation

## 2014-08-24 NOTE — Progress Notes (Signed)
Consult Note: Gyn-Onc  [name] 28 y.o. female  CC:  Chief Complaint  Patient presents with  . CIN III    HPI: Patient is seen in consultation at the request of Dr. Jaymes Graff for cervical dysplasia complicating pregnancy.  Patient is a 28 year old gravida 3 para 2 s/p repeat C/S 04/07/14. She does have a history of abnormal Pap smears in the past which normalized and did not require any therapy. Her last Pap smear was at the time of her annual visit last year. During this pregnancy she had a Pap smear that revealed high-grade dysplasia. She was seen by Dr. Normand Sloop and underwent a colposcopic biopsy on 11/03/2013. 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 She is referred to our clinic for evaluation of this.  I last saw her in May when she was postpartum. Colposcopic evaluation showed a lesion on the edge of the cervix. A biopsy was performed of this that revealed CIN-2. The plan had originally been for her to have a LEEP with Dr. Normand Sloop. Due to not having a time off of work the patient was not able to proceed with that. She's tentatively scheduled for a LEEP with me in the operating room on September 16. However, as it is been so long that she's been evaluated we want to see her back to see if there has been resolution of the lesion. She was on birth control pills which she stopped as she was bleeding fairly regularly. She is currently not nursing. She is sexually active and they're using condoms. Her last cycle was August 7.  Current Meds:  Outpatient Encounter Prescriptions as of 08/24/2014  Medication Sig  . [DISCONTINUED] ferrous sulfate 325 (65 FE) MG tablet Take 1 tablet (325 mg total) by mouth 2 (two) times daily with a meal.  . [DISCONTINUED] HYDROcodone-acetaminophen (NORCO/VICODIN) 5-325 MG per tablet   . [DISCONTINUED] hydrocortisone cream 1 % Apply 1 application topically as needed for itching.  . [DISCONTINUED]  ibuprofen (ADVIL,MOTRIN) 600 MG tablet Take 1 tablet (600 mg total) by mouth every 6 (six) hours as needed.  . [DISCONTINUED] Prenatal Vit-Min-FA-Fish Oil (CVS PRENATAL GUMMY PO) Take 2 each by mouth daily.  . [DISCONTINUED] promethazine (PHENERGAN) 25 MG tablet   . [DISCONTINUED] SPRINTEC 28 0.25-35 MG-MCG tablet     Allergy: No Known Allergies  Social Hx:   History   Social History  . Marital Status: Single    Spouse Name: N/A    Number of Children: N/A  . Years of Education: N/A   Occupational History  . Not on file.   Social History Main Topics  . Smoking status: Never Smoker   . Smokeless tobacco: Never Used  . Alcohol Use: Yes     Comment: rarely  . Drug Use: No  . Sexual Activity: Yes   Other Topics Concern  . Not on file   Social History Narrative   ** Merged History Encounter **        Past Surgical Hx:  Past Surgical History  Procedure Laterality Date  . Cesarean section    . Wisdom tooth extraction  2012  . Cesarean section N/A 04/07/2014    Procedure: CESAREAN SECTION;  Surgeon: Michael Litter, MD;  Location: [facility];  Service: Obstetrics;  Laterality: N/A;    Past Medical Hx:  Past Medical History  Diagnosis Date  . Urinary tract infection   . Abnormal Pap smear     during preg  .  Chlamydia   . Kidney stone     Oncology Hx:   No history exists.    Family Hx:  Family History  Problem Relation Age of Onset  . Anesthesia problems Neg Hx   . Alcohol abuse Neg Hx     Vitals:  Blood pressure 111/62, pulse 72, temperature 98.7 F (37.1 C), temperature source Oral, resp. rate 20, height 5' 5.35" (1.66 m), weight 203 lb 1.6 oz (92.126 kg), unknown if currently breastfeeding.  Physical Exam: Well-nourished well-developed female in no acute distress.  Pelvic: Normal external female genitalia. Speculum exam was performed without difficulty. Cervix is without gross lesions. There's no bleeding. Pap smear was submitted without difficulty. After  obtaining the patient's verbal consent acetic acid was applied the cervix. Colposcopy was performed. There are no visible lesions.  Assessment/Plan:  28 year old with high-grade dysplasia of the cervix. We will call her with the results of her Pap smear from today. If the Pap smear is normal there is no visible lesion on colposcopy I will follow her expectantly with repeat Pap smear colposcopy in 6 months. Should the Pap smear revealed high-grade dysplasia, we do know where the previous lesion was and we will proceed with a LEEP based on the prior colposcopic findings. The biopsied lesion revealed moderate dysplasia. The patient is very comfortable with this followup. She knows that I will communicate the results to her by phone. Denisse Whitenack A., MD 08/24/2014, 2:14 PM

## 2014-08-25 ENCOUNTER — Telehealth: Payer: Self-pay | Admitting: *Deleted

## 2014-08-25 NOTE — Telephone Encounter (Signed)
Call from [name] at Dr. Orvan July office regarding pt's surgical procedure. Dr. Orvan July office can do Leep in mid sept if needed. Discussed with [name] ,pt had Colpo 8/26 and pap. If pap results are negative pt will not need Leep.

## 2014-08-26 LAB — CYTOLOGY - PAP

## 2014-08-29 ENCOUNTER — Telehealth: Payer: Self-pay | Admitting: *Deleted

## 2014-08-29 NOTE — Telephone Encounter (Signed)
Called pt notified pap results. Pt to have Leep on Sept 16 pt confirmed. Notified Dr. Orvan July office with results

## 2014-09-07 ENCOUNTER — Encounter (HOSPITAL_BASED_OUTPATIENT_CLINIC_OR_DEPARTMENT_OTHER): Payer: Self-pay | Admitting: *Deleted

## 2014-09-08 ENCOUNTER — Encounter (HOSPITAL_BASED_OUTPATIENT_CLINIC_OR_DEPARTMENT_OTHER): Payer: Self-pay | Admitting: *Deleted

## 2014-09-08 NOTE — Progress Notes (Signed)
NPO AFTER MN.  ARRIVE AT 0700.  NEEDS HG AND URINE PREG.   

## 2014-09-13 NOTE — Anesthesia Preprocedure Evaluation (Signed)
Anesthesia Evaluation  Patient identified by MRN, date of birth, ID band Patient awake    Reviewed: Allergy & Precautions, H&P , NPO status , Patient's Chart, lab work & pertinent test results  History of Anesthesia Complications Negative for: history of anesthetic complications  Airway Mallampati: II TM Distance: >3 FB Neck ROM: Full    Dental  (+) Teeth Intact   Pulmonary neg pulmonary ROS,  breath sounds clear to auscultation        Cardiovascular negative cardio ROS  Rhythm:Regular     Neuro/Psych PSYCHIATRIC DISORDERS Anxiety negative neurological ROS     GI/Hepatic negative GI ROS, Neg liver ROS,   Endo/Other  negative endocrine ROS  Renal/GU History of kidney stones     Musculoskeletal negative musculoskeletal ROS (+)   Abdominal   Peds  Hematology  (+) anemia ,   Anesthesia Other Findings   Reproductive/Obstetrics (+) Pregnancy                           Anesthesia Physical  Anesthesia Plan  ASA: II  Anesthesia Plan: MAC   Post-op Pain Management:    Induction: Intravenous  Airway Management Planned:   Additional Equipment: None  Intra-op Plan:   Post-operative Plan:   Informed Consent: I have reviewed the patients History and Physical, chart, labs and discussed the procedure including the risks, benefits and alternatives for the proposed anesthesia with the patient or authorized representative who has indicated his/her understanding and acceptance.   Dental advisory given  Plan Discussed with: CRNA  Anesthesia Plan Comments:         Anesthesia Quick Evaluation

## 2014-09-14 ENCOUNTER — Encounter (HOSPITAL_BASED_OUTPATIENT_CLINIC_OR_DEPARTMENT_OTHER): Payer: Self-pay

## 2014-09-14 ENCOUNTER — Encounter (HOSPITAL_BASED_OUTPATIENT_CLINIC_OR_DEPARTMENT_OTHER)
Admission: RE | Disposition: A | Discharge status: Home / Self Care | Payer: Self-pay | Source: Ambulatory Visit | Attending: Gynecologic Oncology

## 2014-09-14 ENCOUNTER — Ambulatory Visit (HOSPITAL_BASED_OUTPATIENT_CLINIC_OR_DEPARTMENT_OTHER): Payer: BC Managed Care – PPO | Admitting: Anesthesiology

## 2014-09-14 ENCOUNTER — Ambulatory Visit (HOSPITAL_BASED_OUTPATIENT_CLINIC_OR_DEPARTMENT_OTHER)
Admission: RE | Admit: 2014-09-14 | Discharge: 2014-09-14 | Disposition: A | Discharge status: Home / Self Care | Payer: BC Managed Care – PPO | Source: Ambulatory Visit | Attending: Gynecologic Oncology | Admitting: Gynecologic Oncology

## 2014-09-14 ENCOUNTER — Encounter (HOSPITAL_BASED_OUTPATIENT_CLINIC_OR_DEPARTMENT_OTHER): Payer: BC Managed Care – PPO | Admitting: Anesthesiology

## 2014-09-14 DIAGNOSIS — D069 Carcinoma in situ of cervix, unspecified: Secondary | ICD-10-CM | POA: Diagnosis present

## 2014-09-14 DIAGNOSIS — N879 Dysplasia of cervix uteri, unspecified: Secondary | ICD-10-CM

## 2014-09-14 HISTORY — DX: Carcinoma in situ of cervix, unspecified: D06.9

## 2014-09-14 HISTORY — PX: LEEP: SHX91

## 2014-09-14 HISTORY — DX: Personal history of urinary calculi: Z87.442

## 2014-09-14 HISTORY — DX: Presence of spectacles and contact lenses: Z97.3

## 2014-09-14 LAB — POCT PREGNANCY, URINE: Preg Test, Ur: NEGATIVE

## 2014-09-14 LAB — POCT HEMOGLOBIN-HEMACUE: Hemoglobin: 10.1 g/dL — ABNORMAL LOW (ref 12.0–15.0)

## 2014-09-14 SURGERY — LEEP (LOOP ELECTROSURGICAL EXCISION PROCEDURE)
Anesthesia: Monitor Anesthesia Care | Site: Vagina

## 2014-09-14 MED ORDER — ONDANSETRON HCL 4 MG/2ML IJ SOLN
INTRAMUSCULAR | Status: DC | PRN
  Administered 2014-09-14: 4 mg via INTRAVENOUS

## 2014-09-14 MED ORDER — IODINE STRONG (LUGOLS) 5 % PO SOLN
ORAL | Status: DC | PRN
  Administered 2014-09-14: 2 mL

## 2014-09-14 MED ORDER — FENTANYL CITRATE 0.05 MG/ML IJ SOLN
INTRAMUSCULAR | Status: AC
  Filled 2014-09-14: qty 4

## 2014-09-14 MED ORDER — MIDAZOLAM HCL 2 MG/2ML IJ SOLN
INTRAMUSCULAR | Status: AC
  Filled 2014-09-14: qty 4

## 2014-09-14 MED ORDER — PROPOFOL 10 MG/ML IV EMUL
INTRAVENOUS | Status: DC | PRN
  Administered 2014-09-14: 50 ug/kg/min via INTRAVENOUS

## 2014-09-14 MED ORDER — KETOROLAC TROMETHAMINE 30 MG/ML IJ SOLN
INTRAMUSCULAR | Status: DC | PRN
  Administered 2014-09-14: 30 mg via INTRAVENOUS

## 2014-09-14 MED ORDER — LACTATED RINGERS IV SOLN
INTRAVENOUS | Status: DC
  Administered 2014-09-14: 08:00:00 via INTRAVENOUS
  Filled 2014-09-14: qty 1000

## 2014-09-14 MED ORDER — FENTANYL CITRATE 0.05 MG/ML IJ SOLN
INTRAMUSCULAR | Status: DC | PRN
  Administered 2014-09-14 (×2): 50 ug via INTRAVENOUS

## 2014-09-14 MED ORDER — DEXAMETHASONE SODIUM PHOSPHATE 4 MG/ML IJ SOLN
INTRAMUSCULAR | Status: DC | PRN
  Administered 2014-09-14: 10 mg via INTRAVENOUS

## 2014-09-14 MED ORDER — MIDAZOLAM HCL 5 MG/5ML IJ SOLN
INTRAMUSCULAR | Status: DC | PRN
  Administered 2014-09-14: 1 mg via INTRAVENOUS
  Administered 2014-09-14: 2 mg via INTRAVENOUS
  Administered 2014-09-14: 1 mg via INTRAVENOUS

## 2014-09-14 MED ORDER — LIDOCAINE HCL (CARDIAC) 20 MG/ML IV SOLN
INTRAVENOUS | Status: DC | PRN
  Administered 2014-09-14: 50 mg via INTRAVENOUS

## 2014-09-14 MED ORDER — LIDOCAINE-EPINEPHRINE (PF) 1 %-1:200000 IJ SOLN
INTRAMUSCULAR | Status: DC | PRN
  Administered 2014-09-14: 10 mL

## 2014-09-14 SURGICAL SUPPLY — 44 items
APPLICATOR COTTON TIP 6IN STRL (MISCELLANEOUS) ×3 IMPLANT
BLADE SURG 11 STRL SS (BLADE) ×2 IMPLANT
CANISTER SUCTION 1200CC (MISCELLANEOUS) IMPLANT
CANISTER SUCTION 2500CC (MISCELLANEOUS) ×2 IMPLANT
CATH ROBINSON RED A/P 16FR (CATHETERS) ×3 IMPLANT
CLOTH BEACON ORANGE TIMEOUT ST (SAFETY) ×3 IMPLANT
COVER TABLE BACK 60X90 (DRAPES) ×3 IMPLANT
DRAPE LG THREE QUARTER DISP (DRAPES) ×2 IMPLANT
DRAPE UNDERBUTTOCKS STRL (DRAPE) ×3 IMPLANT
ELECT BALL LEEP 3MM BLK (ELECTRODE) IMPLANT
ELECT BALL LEEP 5MM RED (ELECTRODE) ×2 IMPLANT
ELECT BLADE 6.5 .24CM SHAFT (ELECTRODE) IMPLANT
ELECT LOOP LEEP RND 10X10 YLW (CUTTING LOOP)
ELECT LOOP LEEP RND 15X12 GRN (CUTTING LOOP)
ELECT LOOP LEEP RND 20X12 WHT (CUTTING LOOP) ×3
ELECT REM PT RETURN 9FT ADLT (ELECTROSURGICAL) ×3
ELECTRODE LOOP LP RND 10X10YLW (CUTTING LOOP) IMPLANT
ELECTRODE LOOP LP RND 15X12GRN (CUTTING LOOP) IMPLANT
ELECTRODE LOOP LP RND 20X12WHT (CUTTING LOOP) IMPLANT
ELECTRODE REM PT RTRN 9FT ADLT (ELECTROSURGICAL) ×1 IMPLANT
GLOVE BIO SURGEON STRL SZ 6.5 (GLOVE) ×1 IMPLANT
GLOVE BIO SURGEON STRL SZ7 (GLOVE) ×3 IMPLANT
GLOVE BIO SURGEONS STRL SZ 6.5 (GLOVE) ×1
GLOVE INDICATOR 6.5 STRL GRN (GLOVE) ×2 IMPLANT
GOWN PREVENTION PLUS LG XLONG (DISPOSABLE) ×1 IMPLANT
GOWN STRL REIN XL XLG (GOWN DISPOSABLE) ×1 IMPLANT
GOWN STRL REUS W/ TWL LRG LVL3 (GOWN DISPOSABLE) IMPLANT
GOWN STRL REUS W/TWL LRG LVL3 (GOWN DISPOSABLE) ×6
LEGGING LITHOTOMY PAIR STRL (DRAPES) ×2 IMPLANT
NDL SPNL 22GX3.5 QUINCKE BK (NEEDLE) ×1 IMPLANT
NEEDLE SPNL 22GX3.5 QUINCKE BK (NEEDLE) ×3 IMPLANT
PACK BASIN DAY SURGERY FS (CUSTOM PROCEDURE TRAY) ×3 IMPLANT
PAD OB MATERNITY 4.3X12.25 (PERSONAL CARE ITEMS) ×3 IMPLANT
PAD PREP 24X48 CUFFED NSTRL (MISCELLANEOUS) ×3 IMPLANT
PENCIL BUTTON HOLSTER BLD 10FT (ELECTRODE) ×3 IMPLANT
SCOPETTES 8  STERILE (MISCELLANEOUS) ×2
SCOPETTES 8 STERILE (MISCELLANEOUS) ×1 IMPLANT
SUT VIC AB 0 SH 27 (SUTURE) ×2 IMPLANT
TOWEL OR 17X24 6PK STRL BLUE (TOWEL DISPOSABLE) ×3 IMPLANT
TRAY DSU PREP LF (CUSTOM PROCEDURE TRAY) ×2 IMPLANT
TUBE CONNECTING 12'X1/4 (SUCTIONS) ×1
TUBE CONNECTING 12X1/4 (SUCTIONS) ×2 IMPLANT
VACUUM HOSE/TUBING 7/8INX6FT (MISCELLANEOUS) ×3 IMPLANT
YANKAUER SUCT BULB TIP NO VENT (SUCTIONS) ×2 IMPLANT

## 2014-09-14 NOTE — Anesthesia Procedure Notes (Signed)
Date/Time: 09/14/2014 8:30 AM Performed by: Norva Pavlov Pre-anesthesia Checklist: Patient identified, Timeout performed, Emergency Drugs available, Suction available and Patient being monitored Patient Re-evaluated:Patient Re-evaluated prior to inductionOxygen Delivery Method: Simple face mask Preoxygenation: Pre-oxygenation with 100% oxygen

## 2014-09-14 NOTE — Op Note (Signed)
PATIENT: [name] DATE OF BIRTH: 06-12-1986 ENCOUNTER DATE: [phone]   Preop Diagnosis: CIN 3  Postoperative Diagnosis: Same  Surgery: LEEP cone  Surgeons:  Marielena Harvell A. Duard Brady, MD  Anesthesia: MAC, 10 mL 1%v lidocaine 1:200, 000 epi  Estimated blood loss: < 10ml  IVF:  200 ml   Complications: None   Pathology: LEEP cone with suture at 12:00  Operative findings:  Lugols non-staining region inferior lip of the cervix, within the LEEP margin visually  Procedure: The patient was identified in the preoperative holding area. Informed consent was signed on the chart. Patient was seen history was reviewed and exam was performed.   The patient was then taken to the operating room and placed in the supine position with SCD hose on. MAC anesthesia was then induced without difficulty. She was then placed in the dorsolithotomy position. The vagina was prepped with Betadine.  The patient was then draped.Timeout was performed the patient, procedure, antibiotic, allergy, and length of procedure. Sterile coated speculum was placed in the vagina. 10mL of the lidocaine and epinephrine solution was injected for a paracervical block. Lugols was placed and a flame lesion was noted on the inferior lip of the cervix. A large loop was chosen and the LEEP was performed to the appropriate depth. The LEEP cone was marked at 12:00 with a suture. Roller ball was used to obtain hemostasis.  Toradol was given by anesthesia.  All instrument, suture, laparotomy, Ray-Tec, and needle counts were correct x2. The patient tolerated the procedure well and was taken recovery room in stable condition. This is Cleda Mccreedy dictating an operative note on [name].

## 2014-09-14 NOTE — Transfer of Care (Addendum)
Immediate Anesthesia Transfer of Care Note  Patient: [name]  Procedure(s) Performed: Procedure(s) (LRB): LOOP ELECTROSURGICAL EXCISION PROCEDURE (LEEP) (N/A)  Patient Location: PACU  Anesthesia Type:MAC  Level of Consciousness: awake, alert  and oriented  Airway & Oxygen Therapy: Patient Spontanous Breathing and Patient connected to face mask oxygen  Post-op Assessment: Report given to PACU RN and Post -op Vital signs reviewed and stable  Post vital signs: Reviewed and stable  Complications: No apparent anesthesia complications

## 2014-09-14 NOTE — Anesthesia Postprocedure Evaluation (Signed)
Anesthesia Post Note  Patient: [name]  Procedure(s) Performed: Procedure(s) (LRB): LOOP ELECTROSURGICAL EXCISION PROCEDURE (LEEP) (N/A)  Anesthesia type: MAC  Patient location: PACU  Post pain: Pain level controlled  Post assessment: Post-op Vital signs reviewed  Last Vitals: BP 110/58  Pulse 72  Temp(Src) 36.1 C (Oral)  Resp 16  Wt 205 lb 8 oz (93.214 kg)  SpO2 100%  LMP 08/29/2014  Breastfeeding? No  Post vital signs: Reviewed  Level of consciousness: awake  Complications: No apparent anesthesia complications

## 2014-09-14 NOTE — Interval H&P Note (Signed)
History and Physical Interval Note:  09/14/2014 8:21 AM  [name]  has presented today for surgery, with the diagnosis of CIN  The various methods of treatment have been discussed with the patient and family. After consideration of risks, benefits and other options for treatment, the patient has consented to  Procedure(s): LOOP ELECTROSURGICAL EXCISION PROCEDURE (LEEP) (N/A) as a surgical intervention .  The patient's history has been reviewed, patient examined, no change in status, stable for surgery.  I have reviewed the patient's chart and labs.  Questions were answered to the patient's satisfaction.     Sajjad Honea A.

## 2014-09-14 NOTE — Discharge Instructions (Signed)

## 2014-09-14 NOTE — H&P (Signed)
CC:  Chief Complaint   Patient presents with   .  CIN III   HPI:  Patient is seen in consultation at the request of Dr. Jaymes Graff for cervical dysplasia complicating pregnancy.  Patient is a 28 year old gravida 3 para 2 s/p repeat C/S 04/07/14. She does have a history of abnormal Pap smears in the past which normalized and did not require any therapy. Her last Pap smear was at the time of her annual visit last year. During this pregnancy she had a Pap smear that revealed high-grade dysplasia. She was seen by Dr. Normand Sloop and underwent a colposcopic biopsy on 11/03/2013. 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 She is referred to our clinic for evaluation of this.   I last saw her in May when she was postpartum. Colposcopic evaluation showed a lesion on the edge of the cervix. A biopsy was performed of this that revealed CIN-2. The plan had originally been for her to have a LEEP with Dr. Normand Sloop. Due to not having a time off of work the patient was not able to proceed with that. She's tentatively scheduled for a LEEP with me in the operating room on September 16. However, as it is been so long that she's been evaluated we want to see her back to see if there has been resolution of the lesion. She was on birth control pills which she stopped as she was bleeding fairly regularly. She is currently not nursing. She is sexually active and they're using condoms. Her last cycle was August 7.  Current Meds:  Outpatient Encounter Prescriptions as of 08/24/2014   Medication  Sig   .  [DISCONTINUED] ferrous sulfate 325 (65 FE) MG tablet  Take 1 tablet (325 mg total) by mouth 2 (two) times daily with a meal.   .  [DISCONTINUED] HYDROcodone-acetaminophen (NORCO/VICODIN) 5-325 MG per tablet    .  [DISCONTINUED] hydrocortisone cream 1 %  Apply 1 application topically as needed for itching.   .  [DISCONTINUED] ibuprofen (ADVIL,MOTRIN) 600 MG tablet  Take 1 tablet  (600 mg total) by mouth every 6 (six) hours as needed.   .  [DISCONTINUED] Prenatal Vit-Min-FA-Fish Oil ([employer]S PRENATAL GUMMY PO)  Take 2 each by mouth daily.   .  [DISCONTINUED] promethazine (PHENERGAN) 25 MG tablet    .  [DISCONTINUED] SPRINTEC 28 0.25-35 MG-MCG tablet    Allergy: No Known Allergies  Social Hx:  History    Social History   .  Marital Status:  Single     Spouse Name:  N/A     Number of Children:  N/A   .  Years of Education:  N/A    Occupational History   .  Not on file.    Social History Main Topics   .  Smoking status:  Never Smoker   .  Smokeless tobacco:  Never Used   .  Alcohol Use:  Yes      Comment: rarely   .  Drug Use:  No   .  Sexual Activity:  Yes    Other Topics  Concern   .  Not on file    Social History Narrative    ** Merged History Encounter **      Past Surgical Hx:  Past Surgical History   Procedure  Laterality  Date   .  Cesarean section     .  Wisdom tooth extraction   2012   .  Cesarean section  N/A  04/07/2014     Procedure: CESAREAN SECTION; Surgeon: Michael Litter, MD; Location: [facility]; Service: Obstetrics; Laterality: N/A;   Past Medical Hx:  Past Medical History   Diagnosis  Date   .  Urinary tract infection    .  Abnormal Pap smear      during preg   .  Chlamydia    .  Kidney stone    Oncology Hx:   No history exists.   Family Hx:  Family History   Problem  Relation  Age of Onset   .  Anesthesia problems  Neg Hx    .  Alcohol abuse  Neg Hx    Vitals: Blood pressure 111/62, pulse 72, temperature 98.7 F (37.1 C), temperature source Oral, resp. rate 20, height 5' 5.35" (1.66 m), weight 203 lb 1.6 oz (92.126 kg), unknown if currently breastfeeding.  Physical Exam:  Well-nourished well-developed female in no acute distress.  Pelvic: Normal external female genitalia. Speculum exam was performed without difficulty. Cervix is without gross lesions. There's no bleeding. Pap smear was submitted without difficulty. After  obtaining the patient's verbal consent acetic acid was applied the cervix. Colposcopy was performed. There are no visible lesions.  Assessment/Plan:  28 year old with high-grade dysplasia of the cervix.Pap smear revealed HGSIL and the decision was made to proceed with LEEP.

## 2014-09-15 ENCOUNTER — Encounter (HOSPITAL_BASED_OUTPATIENT_CLINIC_OR_DEPARTMENT_OTHER): Payer: Self-pay | Admitting: Gynecologic Oncology

## 2014-09-16 ENCOUNTER — Telehealth: Payer: Self-pay | Admitting: Gynecologic Oncology

## 2014-09-16 NOTE — Telephone Encounter (Signed)
LM for patient to call me back regarding LEEP results. PG

## 2014-10-31 ENCOUNTER — Encounter (HOSPITAL_BASED_OUTPATIENT_CLINIC_OR_DEPARTMENT_OTHER): Payer: Self-pay | Admitting: Gynecologic Oncology

## 2014-11-17 ENCOUNTER — Ambulatory Visit: Payer: BC Managed Care – PPO | Admitting: Gynecologic Oncology

## 2014-12-07 ENCOUNTER — Encounter: Payer: Self-pay | Admitting: Gynecologic Oncology

## 2014-12-07 ENCOUNTER — Ambulatory Visit: Payer: BC Managed Care – PPO | Attending: Gynecologic Oncology | Admitting: Gynecologic Oncology

## 2014-12-07 VITALS — BP 112/65 | HR 72 | Temp 98.6°F | Resp 20 | Ht 65.0 in | Wt 206.5 lb

## 2014-12-07 DIAGNOSIS — D069 Carcinoma in situ of cervix, unspecified: Secondary | ICD-10-CM

## 2014-12-07 NOTE — Progress Notes (Signed)
Consult Note: Gyn-Onc  [name] 28 y.o. female  CC:  Chief Complaint  Patient presents with  . CIN III    Follow up    HPI: Patient is seen in consultation at the request of Dr. Jaymes GraffNaima Dillard for cervical dysplasia complicating pregnancy.  Patient is a 28 year old gravida 3 para 2 s/p repeat C/S 04/07/14. She does have a history of abnormal Pap smears in the past which normalized and did not require any therapy. Her last Pap smear was at the time of her annual visit last year. During this pregnancy she had a Pap smear that revealed high-grade dysplasia. She was seen by Dr. Normand Sloopillard and underwent a colposcopic biopsy on 11/03/2013. 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   Postpartum she had a Pap smear that revealed high-grade dysplasia. She underwent a LEEP in the operating room on September 16 2015th. It revealed high-grade dysplasia CIN-3 with endocervical gland involvement and endocervical margin involvement. The ectocervical margin was negative for dysplasia. She comes in today for her post LEEP check. She's overall doing quite well. Her cycle was at the end of November. She's not sexually active. She is no longer nursing.    Current Meds:  No outpatient encounter prescriptions on file as of 12/07/2014.    Allergy: No Known Allergies  Social Hx:   History   Social History  . Marital Status: Single    Spouse Name: N/A    Number of Children: N/A  . Years of Education: N/A   Occupational History  . Not on file.   Social History Main Topics  . Smoking status: Never Smoker   . Smokeless tobacco: Never Used  . Alcohol Use: Yes     Comment: rarely  . Drug Use: No  . Sexual Activity: Yes   Other Topics Concern  . Not on file   Social History Narrative   ** Merged History Encounter **        Past Surgical Hx:  Past Surgical History  Procedure Laterality Date  . Cesarean section  12-23-2002  . Wisdom tooth  extraction  2012  . Cesarean section N/A 04/07/2014    Procedure: CESAREAN SECTION;  Surgeon: Michael LitterNaima A Dillard, MD;  Location: [facility];  Service: Obstetrics;  Laterality: N/A;  . Leep N/A 09/14/2014    Procedure: LOOP ELECTROSURGICAL EXCISION PROCEDURE (LEEP);  Surgeon: Bernita BuffyPaola A. Duard BradyGehrig, MD;  [facility] [place];  Service: Gynecology;  Laterality: N/A;    Past Medical Hx:  Past Medical History  Diagnosis Date  . CIN III (cervical intraepithelial neoplasia III)   . History of kidney stones   . Wears glasses     Oncology Hx:   No history exists.    Family Hx:  Family History  Problem Relation Age of Onset  . Anesthesia problems Neg Hx   . Alcohol abuse Neg Hx     Vitals:  Blood pressure 112/65, pulse 72, temperature 98.6 F (37 C), temperature source Oral, resp. rate 20, height 5\' 5"  (1.651 m), weight 206 lb 8 oz (93.668 kg), not currently breastfeeding.  Physical Exam: Well-nourished well-developed female in no acute distress.  Pelvic: Normal external female genitalia. Speculum exam was performed without difficulty. Cervix is without gross lesions. Cervix is well-healed status post LEEP Assessment/Plan:  28 year old with high-grade dysplasia of the cervix. She is status post LEEP. She did have a positive endocervical margin. The ectocervical margin was negative. She will return to see  us in 3 months for repeat Pap smear. Farzad Tibbetts A., MD 12/07/2014, 2:02 PM

## 2014-12-07 NOTE — Patient Instructions (Signed)
Return to clinic in 3 months to see Dr. Duard BradyGehrig for a Pap smear.

## 2015-03-02 ENCOUNTER — Encounter: Payer: Self-pay | Admitting: Gynecologic Oncology

## 2015-03-02 ENCOUNTER — Ambulatory Visit: Payer: BC Managed Care – PPO | Admitting: Gynecologic Oncology

## 2015-03-02 ENCOUNTER — Other Ambulatory Visit (HOSPITAL_COMMUNITY)
Admission: RE | Admit: 2015-03-02 | Discharge: 2015-03-02 | Disposition: A | Discharge status: Home / Self Care | Payer: BLUE CROSS/BLUE SHIELD | Source: Ambulatory Visit | Attending: Gynecologic Oncology | Admitting: Gynecologic Oncology

## 2015-03-02 ENCOUNTER — Ambulatory Visit: Payer: BLUE CROSS/BLUE SHIELD | Attending: Gynecologic Oncology | Admitting: Gynecologic Oncology

## 2015-03-02 VITALS — BP 115/48 | HR 53 | Temp 98.4°F | Resp 22 | Ht 65.0 in | Wt 206.0 lb

## 2015-03-02 DIAGNOSIS — Z01411 Encounter for gynecological examination (general) (routine) with abnormal findings: Secondary | ICD-10-CM | POA: Insufficient documentation

## 2015-03-02 DIAGNOSIS — N879 Dysplasia of cervix uteri, unspecified: Secondary | ICD-10-CM | POA: Diagnosis present

## 2015-03-02 NOTE — Progress Notes (Signed)
Consult Note: Gyn-Onc  [name] 29 y.o. female  CC:  Chief Complaint  Patient presents with  . CIN III    HPI: Patient is seen in consultation at the request of Dr. Jaymes GraffNaima Dillard for cervical dysplasia complicating pregnancy.  Patient is a 29 year old gravida 3 para 2 s/p repeat C/S 04/07/14. She does have a history of abnormal Pap smears in the past which normalized and did not require any therapy. Her last Pap smear was at the time of her annual visit last year. During this pregnancy she had a Pap smear that revealed high-grade dysplasia. She was seen by Dr. Normand Sloopillard and underwent a colposcopic biopsy on 11/03/2013. 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   Postpartum she had a Pap smear that revealed high-grade dysplasia. She underwent a LEEP in the operating room on September 16 2015th. It revealed high-grade dysplasia CIN-3 with endocervical gland involvement and endocervical margin involvement. The ectocervical margin was negative for dysplasia. She comes in today for her first Pap smear. She is currently on her cycle. She is on birth control pills. She is sexually active. She and her partner do not believe that they were more children but haven't completely ruled that out. She may be interested in an IUD in the future. Her daughter is doing well. She has an ear infection currently.    Current Meds:  Outpatient Encounter Prescriptions as of 03/02/2015  Medication Sig  . SPRINTEC 28 0.25-35 MG-MCG tablet Take 1 tablet by mouth daily.    Allergy: No Known Allergies  Social Hx:   History   Social History  . Marital Status: Single    Spouse Name: N/A  . Number of Children: N/A  . Years of Education: N/A   Occupational History  . Not on file.   Social History Main Topics  . Smoking status: Never Smoker   . Smokeless tobacco: Never Used  . Alcohol Use: Yes     Comment: rarely  . Drug Use: No  . Sexual Activity: Yes    Other Topics Concern  . Not on file   Social History Narrative   ** Merged History Encounter **        Past Surgical Hx:  Past Surgical History  Procedure Laterality Date  . Cesarean section  12-23-2002  . Wisdom tooth extraction  2012  . Cesarean section N/A 04/07/2014    Procedure: CESAREAN SECTION;  Surgeon: Michael LitterNaima A Dillard, MD;  Location: [facility];  Service: Obstetrics;  Laterality: N/A;  . Leep N/A 09/14/2014    Procedure: LOOP ELECTROSURGICAL EXCISION PROCEDURE (LEEP);  Surgeon: Bernita BuffyPaola A. Duard BradyGehrig, MD;  [facility] [place];  Service: Gynecology;  Laterality: N/A;    Past Medical Hx:  Past Medical History  Diagnosis Date  . CIN III (cervical intraepithelial neoplasia III)   . History of kidney stones   . Wears glasses     Oncology Hx:   No history exists.    Family Hx:  Family History  Problem Relation Age of Onset  . Anesthesia problems Neg Hx   . Alcohol abuse Neg Hx     Vitals:  Blood pressure 115/48, pulse 53, temperature 98.4 F (36.9 C), temperature source Oral, resp. rate 22, height 5\' 5"  (1.651 m), weight 206 lb (93.441 kg), not currently breastfeeding.  Physical Exam: Well-nourished well-developed female in no acute distress.  Pelvic: Normal external female genitalia. Speculum exam was performed without difficulty. Cervix is without gross  lesions. There is menstrual flow. Pap smear was submitted without difficulty. Assessment/Plan:  29 year old with high-grade dysplasia of the cervix. She is status post LEEP. She did have a positive endocervical margin. The ectocervical margin was negative. We will notify her of the results for Pap smear from today. If her Pap smear is normal she'll be released from our clinic and can follow-up with Dr. Normand Sloop. Dwan Fennel A., MD 03/02/2015, 12:56 PM

## 2015-03-02 NOTE — Patient Instructions (Signed)
We will notify you of the results of your Pap smear. If it is normal you may follow up with Dr. Normand Sloopillard for routine gynecology.

## 2015-03-05 ENCOUNTER — Emergency Department (HOSPITAL_COMMUNITY)
Admission: EM | Admit: 2015-03-05 | Discharge: 2015-03-06 | Disposition: A | Discharge status: Home / Self Care | Payer: BLUE CROSS/BLUE SHIELD | Attending: Emergency Medicine | Admitting: Emergency Medicine

## 2015-03-05 ENCOUNTER — Encounter (HOSPITAL_COMMUNITY): Payer: Self-pay | Admitting: *Deleted

## 2015-03-05 DIAGNOSIS — R112 Nausea with vomiting, unspecified: Secondary | ICD-10-CM | POA: Diagnosis not present

## 2015-03-05 DIAGNOSIS — Z973 Presence of spectacles and contact lenses: Secondary | ICD-10-CM | POA: Insufficient documentation

## 2015-03-05 DIAGNOSIS — Z87442 Personal history of urinary calculi: Secondary | ICD-10-CM | POA: Insufficient documentation

## 2015-03-05 DIAGNOSIS — R1013 Epigastric pain: Secondary | ICD-10-CM | POA: Insufficient documentation

## 2015-03-05 DIAGNOSIS — Z8742 Personal history of other diseases of the female genital tract: Secondary | ICD-10-CM | POA: Diagnosis not present

## 2015-03-05 DIAGNOSIS — Z3202 Encounter for pregnancy test, result negative: Secondary | ICD-10-CM | POA: Diagnosis not present

## 2015-03-05 DIAGNOSIS — Z793 Long term (current) use of hormonal contraceptives: Secondary | ICD-10-CM | POA: Insufficient documentation

## 2015-03-05 LAB — URINALYSIS, ROUTINE W REFLEX MICROSCOPIC
Bilirubin Urine: NEGATIVE
Glucose, UA: NEGATIVE mg/dL
Ketones, ur: NEGATIVE mg/dL
Leukocytes, UA: NEGATIVE
Nitrite: NEGATIVE
Protein, ur: NEGATIVE mg/dL
Specific Gravity, Urine: 1.023 (ref 1.005–1.030)
Urobilinogen, UA: 1 mg/dL (ref 0.0–1.0)
pH: 6.5 (ref 5.0–8.0)

## 2015-03-05 LAB — COMPREHENSIVE METABOLIC PANEL
ALT: 16 U/L (ref 0–35)
AST: 19 U/L (ref 0–37)
Albumin: 3.7 g/dL (ref 3.5–5.2)
Alkaline Phosphatase: 56 U/L (ref 39–117)
Anion gap: 5 (ref 5–15)
BUN: 8 mg/dL (ref 6–23)
CO2: 30 mmol/L (ref 19–32)
Calcium: 8.6 mg/dL (ref 8.4–10.5)
Chloride: 102 mmol/L (ref 96–112)
Creatinine, Ser: 0.78 mg/dL (ref 0.50–1.10)
GFR calc Af Amer: >90 mL/min (ref >90)
GFR calc non Af Amer: >90 mL/min (ref >90)
Glucose, Bld: 98 mg/dL (ref 70–99)
Potassium: 3.7 mmol/L (ref 3.5–5.1)
Sodium: 137 mmol/L (ref 135–145)
Total Bilirubin: 0.5 mg/dL (ref 0.3–1.2)
Total Protein: 8 g/dL (ref 6.0–8.3)

## 2015-03-05 LAB — CBC WITH DIFFERENTIAL/PLATELET
Basophils Absolute: 0 10*3/uL (ref 0.0–0.1)
Basophils Relative: 0 % (ref 0–1)
Eosinophils Absolute: 0.1 10*3/uL (ref 0.0–0.7)
Eosinophils Relative: 1 % (ref 0–5)
HCT: 33.2 % — ABNORMAL LOW (ref 36.0–46.0)
Hemoglobin: 10.5 g/dL — ABNORMAL LOW (ref 12.0–15.0)
Lymphocytes Relative: 17 % (ref 12–46)
Lymphs Abs: 1.8 10*3/uL (ref 0.7–4.0)
MCH: 25.2 pg — ABNORMAL LOW (ref 26.0–34.0)
MCHC: 31.6 g/dL (ref 30.0–36.0)
MCV: 79.8 fL (ref 78.0–100.0)
Monocytes Absolute: 0.5 10*3/uL (ref 0.1–1.0)
Monocytes Relative: 5 % (ref 3–12)
Neutro Abs: 8.3 10*3/uL — ABNORMAL HIGH (ref 1.7–7.7)
Neutrophils Relative %: 77 % (ref 43–77)
Platelets: 470 10*3/uL — ABNORMAL HIGH (ref 150–400)
RBC: 4.16 MIL/uL (ref 3.87–5.11)
RDW: 15.2 % (ref 11.5–15.5)
WBC: 10.8 10*3/uL — ABNORMAL HIGH (ref 4.0–10.5)

## 2015-03-05 LAB — URINE MICROSCOPIC-ADD ON

## 2015-03-05 LAB — LIPASE, BLOOD: Lipase: 24 U/L (ref 11–59)

## 2015-03-05 MED ORDER — ONDANSETRON 4 MG PO TBDP
8.0000 mg | ORAL_TABLET | Freq: Once | ORAL | Status: AC
  Administered 2015-03-05: 8 mg via ORAL
  Filled 2015-03-05: qty 2

## 2015-03-05 MED ORDER — PROMETHAZINE HCL 25 MG PO TABS: 25.0000 mg | ORAL_TABLET | Freq: Four times a day (QID) | ORAL | Status: DC | PRN

## 2015-03-05 NOTE — ED Notes (Signed)
POCT Hcg: Negative

## 2015-03-05 NOTE — ED Provider Notes (Signed)
CSN: [id number]     Arrival date & time 03/05/15  2035 History   First MD Initiated Contact with Patient 03/05/15 2142     Chief Complaint  Patient presents with  . Abdominal Pain     Patient is a 29 y.o. female presenting with abdominal pain. The history is provided by the patient. No language interpreter was used.  Abdominal Pain  Ms. [name] presents to the ED for evaluation of abdominal pain.  Her pain started this morning with upper abdominal pain described as a cramping feeling.  The pain is waxing and waning.  Pain does not radiate.  No change with movement or activity.  Pain lasts for a few minutes at a time with no clear trigger.  She has associated nausea with one episode of emesis.  She has food aversion.  No diarrhea, dysuria, vaginal discharge.  Her children were recently sick with sore throat, fever, sinus infection.  She denies any medical problems.  Sxs are moderate in nature.   Past Medical History  Diagnosis Date  . CIN III (cervical intraepithelial neoplasia III)   . History of kidney stones   . Wears glasses    Past Surgical History  Procedure Laterality Date  . Cesarean section  12-23-2002  . Wisdom tooth extraction  2012  . Cesarean section N/A 04/07/2014    Procedure: CESAREAN SECTION;  Surgeon: Michael Litter, MD;  Location: [facility];  Service: Obstetrics;  Laterality: N/A;  . Leep N/A 09/14/2014    Procedure: LOOP ELECTROSURGICAL EXCISION PROCEDURE (LEEP);  Surgeon: Bernita Buffy. Duard Brady, MD;  Location: [facility];  Service: Gynecology;  Laterality: N/A;   Family History  Problem Relation Age of Onset  . Anesthesia problems Neg Hx   . Alcohol abuse Neg Hx    History  Substance Use Topics  . Smoking status: Never Smoker   . Smokeless tobacco: Never Used  . Alcohol Use: Yes     Comment: rarely   OB History    Gravida Para Term Preterm AB TAB SAB Ectopic Multiple Living   Review of Systems  Gastrointestinal: Positive for  abdominal pain.  All other systems reviewed and are negative.     Allergies  Review of patient's allergies indicates no known allergies.  Home Medications   Prior to Admission medications   Medication Sig Start Date End Date Taking? Authorizing Provider  SPRINTEC 28 0.25-35 MG-MCG tablet Take 1 tablet by mouth daily. 02/05/15  Yes Historical Provider, MD   BP 110/69 mmHg  Pulse 65  Temp(Src) 98.4 F (36.9 C) (Oral)  Resp 22  SpO2 98%  LMP 03/03/2015 Physical Exam  Constitutional: She is oriented to person, place, and time. She appears well-developed and well-nourished.  HENT:  Head: Normocephalic and atraumatic.  Cardiovascular: Normal rate and regular rhythm.   No murmur heard. Pulmonary/Chest: Effort normal and breath sounds normal. No respiratory distress.  Abdominal: Soft. There is no rebound and no guarding.  Mild epigastric tenderness  Musculoskeletal: She exhibits no edema or tenderness.  Neurological: She is alert and oriented to person, place, and time.  Skin: Skin is warm and dry.  Psychiatric: She has a normal mood and affect. Her behavior is normal.  Nursing note and vitals reviewed.   ED Course  Procedures (including critical care time) Labs Review Labs Reviewed  CBC WITH DIFFERENTIAL/PLATELET - Abnormal; Notable for the following:    WBC 10.8 (*)  Hemoglobin 10.5 (*)    HCT 33.2 (*)    MCH 25.2 (*)    Platelets 470 (*)    Neutro Abs 8.3 (*)    All other components within normal limits  URINALYSIS, ROUTINE W REFLEX MICROSCOPIC - Abnormal; Notable for the following:    APPearance CLOUDY (*)    Hgb urine dipstick MODERATE (*)    All other components within normal limits  URINE MICROSCOPIC-ADD ON - Abnormal; Notable for the following:    Squamous Epithelial / LPF FEW (*)    Bacteria, UA MANY (*)    All other components within normal limits  URINE CULTURE  COMPREHENSIVE METABOLIC PANEL  LIPASE, BLOOD  POC URINE PREG, ED    Imaging Review No  results found.   EKG Interpretation None      MDM   Final diagnoses:  Non-intractable vomiting with nausea, vomiting of unspecified type  Abdominal pain, acute, epigastric    Patient here for evaluation of nausea and epigastric pain. Patient's pain is essentially resolved in the emergency department. Patient is tolerating oral fluids. Clinical picture is not consistent with cholecystitis, pancreatitis, appendicitis. History and UA are not consistent with UTI, culture sent and will treat if culture is positive. Discussed with patient home care for nausea as well as close return precautions for evidence of development of abdominal pain, fevers, new concerning symptoms.    Tilden FossaElizabeth Montey Ebel, MD 03/06/15 [phone]

## 2015-03-05 NOTE — ED Notes (Signed)
abd cramps since this am she has also had nausea vomited once  lmp 2 days ago

## 2015-03-05 NOTE — ED Notes (Signed)
PO challenge started

## 2015-03-05 NOTE — Discharge Instructions (Signed)
Get rechecked immediately if you develop fevers or your abdominal pain returns.     Abdominal Pain, Women Abdominal (stomach, pelvic, or belly) pain can be caused by many things. It is important to tell your doctor:  The location of the pain.  Does it come and go or is it present all the time?  Are there things that start the pain (eating certain foods, exercise)?  Are there other symptoms associated with the pain (fever, nausea, vomiting, diarrhea)? All of this is helpful to know when trying to find the cause of the pain. CAUSES   Stomach: virus or bacteria infection, or ulcer.  Intestine: appendicitis (inflamed appendix), regional ileitis (Crohn's disease), ulcerative colitis (inflamed colon), irritable bowel syndrome, diverticulitis (inflamed diverticulum of the colon), or cancer of the stomach or intestine.  Gallbladder disease or stones in the gallbladder.  Kidney disease, kidney stones, or infection.  Pancreas infection or cancer.  Fibromyalgia (pain disorder).  Diseases of the female organs:  Uterus: fibroid (non-cancerous) tumors or infection.  Fallopian tubes: infection or tubal pregnancy.  Ovary: cysts or tumors.  Pelvic adhesions (scar tissue).  Endometriosis (uterus lining tissue growing in the pelvis and on the pelvic organs).  Pelvic congestion syndrome (female organs filling up with blood just before the menstrual period).  Pain with the menstrual period.  Pain with ovulation (producing an egg).  Pain with an IUD (intrauterine device, birth control) in the uterus.  Cancer of the female organs.  Functional pain (pain not caused by a disease, may improve without treatment).  Psychological pain.  Depression. DIAGNOSIS  Your doctor will decide the seriousness of your pain by doing an examination.  Blood tests.  X-rays.  Ultrasound.  CT scan (computed tomography, special type of X-ray).  MRI (magnetic resonance imaging).  Cultures, for  infection.  Barium enema (dye inserted in the large intestine, to better view it with X-rays).  Colonoscopy (looking in intestine with a lighted tube).  Laparoscopy (minor surgery, looking in abdomen with a lighted tube).  Major abdominal exploratory surgery (looking in abdomen with a large incision). TREATMENT  The treatment will depend on the cause of the pain.   Many cases can be observed and treated at home.  Over-the-counter medicines recommended by your caregiver.  Prescription medicine.  Antibiotics, for infection.  Birth control pills, for painful periods or for ovulation pain.  Hormone treatment, for endometriosis.  Nerve blocking injections.  Physical therapy.  Antidepressants.  Counseling with a psychologist or psychiatrist.  Minor or major surgery. HOME CARE INSTRUCTIONS   Do not take laxatives, unless directed by your caregiver.  Take over-the-counter pain medicine only if ordered by your caregiver. Do not take aspirin because it can cause an upset stomach or bleeding.  Try a clear liquid diet (broth or water) as ordered by your caregiver. Slowly move to a bland diet, as tolerated, if the pain is related to the stomach or intestine.  Have a thermometer and take your temperature several times a day, and record it.  Bed rest and sleep, if it helps the pain.  Avoid sexual intercourse, if it causes pain.  Avoid stressful situations.  Keep your follow-up appointments and tests, as your caregiver orders.  If the pain does not go away with medicine or surgery, you may try:  Acupuncture.  Relaxation exercises (yoga, meditation).  Group therapy.  Counseling. SEEK MEDICAL CARE IF:   You notice certain foods cause stomach pain.  Your home care treatment is not helping your pain.  You need stronger pain medicine.  You want your IUD removed.  You feel faint or lightheaded.  You develop nausea and vomiting.  You develop a rash.  You are  having side effects or an allergy to your medicine. SEEK IMMEDIATE MEDICAL CARE IF:   Your pain does not go away or gets worse.  You have a fever.  Your pain is felt only in portions of the abdomen. The right side could possibly be appendicitis. The left lower portion of the abdomen could be colitis or diverticulitis.  You are passing blood in your stools (bright red or black tarry stools, with or without vomiting).  You have blood in your urine.  You develop chills, with or without a fever.  You pass out. MAKE SURE YOU:   Understand these instructions.  Will watch your condition.  Will get help right away if you are not doing well or get worse. Document Released: 10/13/2007 Document Revised: 05/02/2014 Document Reviewed: 11/02/2009 [facility] Patient Information 2015 [place], [place]. This information is not intended to replace advice given to you by your health care provider. Make sure you discuss any questions you have with your health care provider.

## 2015-03-06 LAB — POC URINE PREG, ED: Preg Test, Ur: NEGATIVE

## 2015-03-07 LAB — URINE CULTURE: Colony Count: 70000

## 2015-03-07 LAB — CYTOLOGY - PAP

## 2015-03-13 ENCOUNTER — Telehealth: Payer: Self-pay | Admitting: *Deleted

## 2015-03-13 NOTE — Telephone Encounter (Signed)
-----   Message from Doylene BodeMelissa D Cross, NP sent at 03/10/2015  1:38 PM EST ----- Please let her know her pap is normal ----- Message -----    From: Lab in [facility] Interface    Sent: 03/07/2015  11:50 AM      To: Doylene BodeMelissa D Cross, NP

## 2015-03-13 NOTE — Telephone Encounter (Signed)
Called patient with results as noted below by Warner MccreedyMelissa Cross, NP.

## 2015-04-05 ENCOUNTER — Telehealth: Payer: Self-pay | Admitting: *Deleted

## 2015-04-05 NOTE — Telephone Encounter (Signed)
Spoke with patient and let her know that she does not need to return to see Dr. Duard BradyGehrig since her last pap smear was normal. Patient instructed to followup annually with Dr. Normand Sloopillard and to please call us in the future with any concerns or issues - patient agreeable to this.

## 2015-04-06 ENCOUNTER — Ambulatory Visit: Payer: Self-pay | Admitting: Gynecologic Oncology

## 2016-03-28 ENCOUNTER — Emergency Department (HOSPITAL_COMMUNITY)
Admission: EM | Admit: 2016-03-28 | Discharge: 2016-03-29 | Disposition: A | Discharge status: Home / Self Care | Payer: BLUE CROSS/BLUE SHIELD | Attending: Emergency Medicine | Admitting: Emergency Medicine

## 2016-03-28 ENCOUNTER — Encounter (HOSPITAL_COMMUNITY): Payer: Self-pay | Admitting: Emergency Medicine

## 2016-03-28 DIAGNOSIS — Z86001 Personal history of in-situ neoplasm of cervix uteri: Secondary | ICD-10-CM | POA: Diagnosis not present

## 2016-03-28 DIAGNOSIS — Z973 Presence of spectacles and contact lenses: Secondary | ICD-10-CM | POA: Insufficient documentation

## 2016-03-28 DIAGNOSIS — Z79899 Other long term (current) drug therapy: Secondary | ICD-10-CM | POA: Diagnosis not present

## 2016-03-28 DIAGNOSIS — R5383 Other fatigue: Secondary | ICD-10-CM | POA: Diagnosis present

## 2016-03-28 DIAGNOSIS — Z87440 Personal history of urinary (tract) infections: Secondary | ICD-10-CM | POA: Insufficient documentation

## 2016-03-28 DIAGNOSIS — J069 Acute upper respiratory infection, unspecified: Secondary | ICD-10-CM | POA: Diagnosis not present

## 2016-03-28 MED ORDER — ONDANSETRON 4 MG PO TBDP
4.0000 mg | ORAL_TABLET | Freq: Once | ORAL | Status: AC
  Administered 2016-03-29: 4 mg via ORAL
  Filled 2016-03-28: qty 1

## 2016-03-28 MED ORDER — IBUPROFEN 800 MG PO TABS
800.0000 mg | ORAL_TABLET | Freq: Once | ORAL | Status: AC
  Administered 2016-03-29: 800 mg via ORAL
  Filled 2016-03-28: qty 1

## 2016-03-28 MED ORDER — ONDANSETRON 4 MG PO TBDP: 4.0000 mg | ORAL_TABLET | Freq: Three times a day (TID) | ORAL | Status: DC | PRN

## 2016-03-28 NOTE — Discharge Instructions (Signed)
Viral Infections A viral infection can be caused by different types of viruses.Most viral infections are not serious and resolve on their own. However, some infections may cause severe symptoms and may lead to further complications. SYMPTOMS Viruses can frequently cause:  Minor sore throat.  Aches and pains.  Headaches.  Runny nose.  Different types of rashes.  Watery eyes.  Tiredness.  Cough.  Loss of appetite.  Gastrointestinal infections, resulting in nausea, vomiting, and diarrhea. These symptoms do not respond to antibiotics because the infection is not caused by bacteria. However, you might catch a bacterial infection following the viral infection. This is sometimes called a "superinfection." Symptoms of such a bacterial infection may include:  Worsening sore throat with pus and difficulty swallowing.  Swollen neck glands.  Chills and a high or persistent fever.  Severe headache.  Tenderness over the sinuses.  Persistent overall ill feeling (malaise), muscle aches, and tiredness (fatigue).  Persistent cough.  Yellow, green, or brown mucus production with coughing. HOME CARE INSTRUCTIONS   Only take over-the-counter or prescription medicines for pain, discomfort, diarrhea, or fever as directed by your caregiver.  Drink enough water and fluids to keep your urine clear or pale yellow. Sports drinks can provide valuable electrolytes, sugars, and hydration.  Get plenty of rest and maintain proper nutrition. Soups and broths with crackers or rice are fine. SEEK IMMEDIATE MEDICAL CARE IF:   You have severe headaches, shortness of breath, chest pain, neck pain, or an unusual rash.  You have uncontrolled vomiting, diarrhea, or you are unable to keep down fluids.  You or your child has an oral temperature above 102 F (38.9 C), not controlled by medicine.  Your baby is older than 3 months with a rectal temperature of 102 F (38.9 C) or higher.  Your baby is 3  months old or younger with a rectal temperature of 100.4 F (38 C) or higher. MAKE SURE YOU:   Understand these instructions.  Will watch your condition.  Will get help right away if you are not doing well or get worse.   This information is not intended to replace advice given to you by your health care provider. Make sure you discuss any questions you have with your health care provider.   Document Released: 09/25/2005 Document Revised: 03/09/2012 Document Reviewed: 05/24/2015 Elsevier Interactive Patient Education 2016 Elsevier Inc. Upper Respiratory Infection, Adult Most upper respiratory infections (URIs) are a viral infection of the air passages leading to the lungs. A URI affects the nose, throat, and upper air passages. The most common type of URI is nasopharyngitis and is typically referred to as "the common cold." URIs run their course and usually go away on their own. Most of the time, a URI does not require medical attention, but sometimes a bacterial infection in the upper airways can follow a viral infection. This is called a secondary infection. Sinus and middle ear infections are common types of secondary upper respiratory infections. Bacterial pneumonia can also complicate a URI. A URI can worsen asthma and chronic obstructive pulmonary disease (COPD). Sometimes, these complications can require emergency medical care and may be life threatening.  CAUSES Almost all URIs are caused by viruses. A virus is a type of germ and can spread from one person to another.  RISKS FACTORS You may be at risk for a URI if:   You smoke.   You have chronic heart or lung disease.  You have a weakened defense (immune) system.   You are   very young or very old.   You have nasal allergies or asthma.  You work in crowded or poorly ventilated areas.  You work in health care facilities or schools. SIGNS AND SYMPTOMS  Symptoms typically develop 2-3 days after you come in contact with a  cold virus. Most viral URIs last 7-10 days. However, viral URIs from the influenza virus (flu virus) can last 14-18 days and are typically more severe. Symptoms may include:   Runny or stuffy (congested) nose.   Sneezing.   Cough.   Sore throat.   Headache.   Fatigue.   Fever.   Loss of appetite.   Pain in your forehead, behind your eyes, and over your cheekbones (sinus pain).  Muscle aches.  DIAGNOSIS  Your health care provider may diagnose a URI by:  Physical exam.  Tests to check that your symptoms are not due to another condition such as:  Strep throat.  Sinusitis.  Pneumonia.  Asthma. TREATMENT  A URI goes away on its own with time. It cannot be cured with medicines, but medicines may be prescribed or recommended to relieve symptoms. Medicines may help:  Reduce your fever.  Reduce your cough.  Relieve nasal congestion. HOME CARE INSTRUCTIONS   Take medicines only as directed by your health care provider.   Gargle warm saltwater or take cough drops to comfort your throat as directed by your health care provider.  Use a warm mist humidifier or inhale steam from a shower to increase air moisture. This may make it easier to breathe.  Drink enough fluid to keep your urine clear or pale yellow.   Eat soups and other clear broths and maintain good nutrition.   Rest as needed.   Return to work when your temperature has returned to normal or as your health care provider advises. You may need to stay home longer to avoid infecting others. You can also use a face mask and careful hand washing to prevent spread of the virus.  Increase the usage of your inhaler if you have asthma.   Do not use any tobacco products, including cigarettes, chewing tobacco, or electronic cigarettes. If you need help quitting, ask your health care provider. PREVENTION  The best way to protect yourself from getting a cold is to practice good hygiene.   Avoid oral or  hand contact with people with cold symptoms.   Wash your hands often if contact occurs.  There is no clear evidence that vitamin C, vitamin E, echinacea, or exercise reduces the chance of developing a cold. However, it is always recommended to get plenty of rest, exercise, and practice good nutrition.  SEEK MEDICAL CARE IF:   You are getting worse rather than better.   Your symptoms are not controlled by medicine.   You have chills.  You have worsening shortness of breath.  You have brown or red mucus.  You have yellow or brown nasal discharge.  You have pain in your face, especially when you bend forward.  You have a fever.  You have swollen neck glands.  You have pain while swallowing.  You have white areas in the back of your throat. SEEK IMMEDIATE MEDICAL CARE IF:   You have severe or persistent:  Headache.  Ear pain.  Sinus pain.  Chest pain.  You have chronic lung disease and any of the following:  Wheezing.  Prolonged cough.  Coughing up blood.  A change in your usual mucus.  You have a stiff neck.  You   have changes in your:  Vision.  Hearing.  Thinking.  Mood. MAKE SURE YOU:   Understand these instructions.  Will watch your condition.  Will get help right away if you are not doing well or get worse.   This information is not intended to replace advice given to you by your health care provider. Make sure you discuss any questions you have with your health care provider.   Document Released: 06/11/2001 Document Revised: 05/02/2015 Document Reviewed: 03/23/2014 Elsevier Interactive Patient Education 2016 Elsevier Inc.  

## 2016-03-28 NOTE — ED Provider Notes (Signed)
CSN: [id number]     Arrival date & time 03/28/16  2205 History  By signing my name below, I, [name], attest that this documentation has been prepared under the direction and in the presence of non-physician practitioner, Danelle BerryLeisa Taitum Alms, PA-C. Electronically Signed: Marisue HumbleMichelle Ingram, Scribe. 03/28/2016. 11:56 PM.   Chief Complaint  Patient presents with  . Fatigue  . Chills   The history is provided by the patient. No language interpreter was used.   HPI Comments:  [name] is a 30 y.o. female who presents to the Emergency Department complaining of gradual onset fatigue, chills, diffuse stomach ache, nausea, slightly loose stool, post nasal drip, weakness and fatigue onset today. Pt reports associated sore throat yesterday, currently alleviated. She took Catering managerAlka Seltzer yesterday with alleviation of sore throat; she also took daytime Alka Seltzer and Tylenol today with mild relief. 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 Denies cough, fever, congestion, diaphoresis, dysuria, hematuria, vaginal abnormalities, ear pain, sinus pressure, vomiting, rash or body aches.  Past Medical History  Diagnosis Date  . CIN III (cervical intraepithelial neoplasia III)   . History of kidney stones   . Wears glasses    Past Surgical History  Procedure Laterality Date  . Cesarean section  12-23-2002  . Wisdom tooth extraction  2012  . Cesarean section N/A 04/07/2014    Procedure: CESAREAN SECTION;  Surgeon: Michael LitterNaima A Dillard, MD;  Location: [facility];  Service: Obstetrics;  Laterality: N/A;  . Leep N/A 09/14/2014    Procedure: LOOP ELECTROSURGICAL EXCISION PROCEDURE (LEEP);  Surgeon: Bernita BuffyPaola A. Duard BradyGehrig, MD;  [facility];  Service: Gynecology;  Laterality: N/A;   Family History  Problem Relation Age of Onset  . Anesthesia problems Neg Hx   . Alcohol abuse Neg Hx    Social History  Substance Use Topics  . Smoking status: Never Smoker   . Smokeless  tobacco: Never Used  . Alcohol Use: Yes     Comment: rarely   OB History    Gravida Para Term Preterm AB TAB SAB Ectopic Multiple Living   3 2 2  1  1   2      Review of Systems  Constitutional: Positive for chills and fatigue. Negative for fever and diaphoresis.  HENT: Positive for postnasal drip. Negative for ear pain and sinus pressure.   Respiratory: Negative for cough.   Gastrointestinal: Positive for nausea and abdominal pain. Negative for vomiting.  Genitourinary: Negative for dysuria, hematuria, vaginal bleeding and vaginal discharge.  Musculoskeletal: Negative for myalgias.  Skin: Negative for rash.  Neurological: Positive for weakness.  All other systems reviewed and are negative.  Allergies  Review of patient's allergies indicates no known allergies.  Home Medications   Prior to Admission medications   Medication Sig Start Date End Date Taking? Authorizing Provider  ondansetron (ZOFRAN ODT) 4 MG disintegrating tablet Take 1 tablet (4 mg total) by mouth every 8 (eight) hours as needed for nausea or vomiting. 03/28/16   Danelle BerryLeisa Yvon Mccord, PA-C  promethazine (PHENERGAN) 25 MG tablet Take 1 tablet (25 mg total) by mouth every 6 (six) hours as needed for nausea or vomiting. 03/05/15   Tilden FossaElizabeth Rees, MD  SPRINTEC 28 0.25-35 MG-MCG tablet Take 1 tablet by mouth daily. 02/05/15   Historical Provider, MD   BP 104/62 mmHg  Pulse 79  Temp(Src) 97.7 F (36.5 C) (Oral)  Resp 18  Ht 5\' 5"  (1.651 m)  Wt 96.163 kg  BMI 35.28 kg/m2  SpO2 96%  LMP 03/15/2016 (Exact Date) Physical Exam  Constitutional: She is oriented to person, place, and time. She appears well-developed and well-nourished. No distress.  HENT:  Head: Normocephalic and atraumatic.  Right Ear: External ear normal.  Left Ear: External ear normal.  Nose: Nose normal.  Mouth/Throat: Oropharynx is clear and moist. No oropharyngeal exudate.  Nasal discharge Mild OP erythema, no exudate, uvula midline  Eyes: Conjunctivae and  EOM are normal. Pupils are equal, round, and reactive to light. Right eye exhibits no discharge. Left eye exhibits no discharge. No scleral icterus.  Neck: Normal range of motion. Neck supple. No JVD present. No tracheal deviation present.  Cardiovascular: Normal rate, regular rhythm, normal heart sounds and intact distal pulses.  Exam reveals no gallop and no friction rub.   No murmur heard. Pulmonary/Chest: Effort normal and breath sounds normal. No stridor. No respiratory distress. She has no wheezes. She has no rales.  Abdominal: Soft. Bowel sounds are normal. She exhibits no distension. There is no tenderness. There is no rebound and no guarding.  Musculoskeletal: Normal range of motion. She exhibits no edema.  Lymphadenopathy:    She has no cervical adenopathy.  Neurological: She is alert and oriented to person, place, and time. She exhibits normal muscle tone. Coordination normal.  Skin: Skin is warm and dry. No rash noted. She is not diaphoretic. No erythema. No pallor.  Psychiatric: She has a normal mood and affect. Her behavior is normal. Judgment and thought content normal.  Nursing note and vitals reviewed.   ED Course  Procedures  DIAGNOSTIC STUDIES:  Oxygen Saturation is 96% on RA, normal by my interpretation.    COORDINATION OF CARE:  11:54 PM Will prescribe nausea medication. Discussed treatment plan with pt at bedside and pt agreed to plan.  Labs Review Labs Reviewed - No data to display  Imaging Review No results found. I have personally reviewed and evaluated these images and lab results as part of my medical decision-making.   EKG Interpretation None      MDM   Pt with URI/viral sx.  Pt is non-toxic appearing, VSS.  D/C home with zofran for nausea.  Encouraged supportive tx with clear fluids, tylenol and ibuprofen as needed.  Final diagnoses:  URI (upper respiratory infection)   I personally performed the services described in this documentation, which  was scribed in my presence. The recorded information has been reviewed and is accurate.     Danelle Berry, PA-C 04/02/16 1610  Azalia Bilis, MD 04/03/16 [phone]

## 2016-03-28 NOTE — ED Notes (Signed)
Pt states yesterday she had a sore throat  Pt states today she took an Catering manageralka seltzer and her sore throat is gone but she has a stomach ache, feels weak and tired, and has chills

## 2016-09-18 ENCOUNTER — Inpatient Hospital Stay (HOSPITAL_COMMUNITY): Payer: Medicaid Other

## 2016-09-18 ENCOUNTER — Inpatient Hospital Stay (HOSPITAL_COMMUNITY)
Admission: AD | Admit: 2016-09-18 | Discharge: 2016-09-18 | Disposition: A | Discharge status: Home / Self Care | Payer: Medicaid Other | Source: Ambulatory Visit | Attending: Obstetrics and Gynecology | Admitting: Obstetrics and Gynecology

## 2016-09-18 ENCOUNTER — Encounter (HOSPITAL_COMMUNITY): Payer: Self-pay | Admitting: *Deleted

## 2016-09-18 DIAGNOSIS — Z79899 Other long term (current) drug therapy: Secondary | ICD-10-CM | POA: Diagnosis not present

## 2016-09-18 DIAGNOSIS — O99011 Anemia complicating pregnancy, first trimester: Secondary | ICD-10-CM

## 2016-09-18 DIAGNOSIS — Z9889 Other specified postprocedural states: Secondary | ICD-10-CM | POA: Diagnosis not present

## 2016-09-18 DIAGNOSIS — D509 Iron deficiency anemia, unspecified: Secondary | ICD-10-CM | POA: Diagnosis not present

## 2016-09-18 DIAGNOSIS — R109 Unspecified abdominal pain: Secondary | ICD-10-CM | POA: Diagnosis not present

## 2016-09-18 DIAGNOSIS — O26899 Other specified pregnancy related conditions, unspecified trimester: Secondary | ICD-10-CM

## 2016-09-18 DIAGNOSIS — Z3491 Encounter for supervision of normal pregnancy, unspecified, first trimester: Secondary | ICD-10-CM

## 2016-09-18 DIAGNOSIS — O9989 Other specified diseases and conditions complicating pregnancy, childbirth and the puerperium: Secondary | ICD-10-CM | POA: Diagnosis not present

## 2016-09-18 DIAGNOSIS — O26891 Other specified pregnancy related conditions, first trimester: Secondary | ICD-10-CM | POA: Diagnosis present

## 2016-09-18 DIAGNOSIS — Z87442 Personal history of urinary calculi: Secondary | ICD-10-CM | POA: Insufficient documentation

## 2016-09-18 DIAGNOSIS — Z3A10 10 weeks gestation of pregnancy: Secondary | ICD-10-CM | POA: Diagnosis not present

## 2016-09-18 DIAGNOSIS — O99019 Anemia complicating pregnancy, unspecified trimester: Secondary | ICD-10-CM

## 2016-09-18 DIAGNOSIS — Z86001 Personal history of in-situ neoplasm of cervix uteri: Secondary | ICD-10-CM | POA: Insufficient documentation

## 2016-09-18 DIAGNOSIS — R102 Pelvic and perineal pain: Secondary | ICD-10-CM | POA: Diagnosis not present

## 2016-09-18 LAB — CBC
HCT: 31 % — ABNORMAL LOW (ref 36.0–46.0)
Hemoglobin: 10.5 g/dL — ABNORMAL LOW (ref 12.0–15.0)
MCH: 28.5 pg (ref 26.0–34.0)
MCHC: 33.9 g/dL (ref 30.0–36.0)
MCV: 84.2 fL (ref 78.0–100.0)
Platelets: 322 10*3/uL (ref 150–400)
RBC: 3.68 MIL/uL — ABNORMAL LOW (ref 3.87–5.11)
RDW: 14.8 % (ref 11.5–15.5)
WBC: 12.8 10*3/uL — ABNORMAL HIGH (ref 4.0–10.5)

## 2016-09-18 LAB — URINALYSIS, ROUTINE W REFLEX MICROSCOPIC
Bilirubin Urine: NEGATIVE
Glucose, UA: NEGATIVE mg/dL
Ketones, ur: NEGATIVE mg/dL
Leukocytes, UA: NEGATIVE
Nitrite: NEGATIVE
Protein, ur: NEGATIVE mg/dL
Specific Gravity, Urine: 1.02 (ref 1.005–1.030)
pH: 7 (ref 5.0–8.0)

## 2016-09-18 LAB — URINE MICROSCOPIC-ADD ON

## 2016-09-18 LAB — WET PREP, GENITAL
Sperm: NONE SEEN
Trich, Wet Prep: NONE SEEN
Yeast Wet Prep HPF POC: NONE SEEN

## 2016-09-18 LAB — HCG, QUANTITATIVE, PREGNANCY: hCG, Beta Chain, Quant, S: 84022 m[IU]/mL — ABNORMAL HIGH (ref <5)

## 2016-09-18 LAB — POCT PREGNANCY, URINE: Preg Test, Ur: POSITIVE — AB

## 2016-09-18 NOTE — MAU Note (Signed)
C/o lower abdominal cramping all day today; had positive UPT at home; 

## 2016-09-18 NOTE — MAU Note (Signed)
C/o lower abdominal cramping all day today; had positive UPT at home;

## 2016-09-18 NOTE — MAU Provider Note (Signed)
History     CSN: [id number]  Arrival date and time: 09/18/16 1410   None     Chief Complaint  Patient presents with  . Possible Pregnancy   [id number] @[redacted]w[redacted]d  by LMP c/o pelvic cramping since last night. Reports pink spotting yesterday, none today. She tried hydration and shower with little help. Denies recent IC. Denies diarrhea and vomiting. No fevers. Denies vaginal discharge, itching, and odor.     OB History    Gravida Para Term Preterm AB Living   4 2 2   1 2    SAB TAB Ectopic Multiple Live Births   1       2      Past Medical History:  Diagnosis Date  . CIN III (cervical intraepithelial neoplasia III)   . History of kidney stones   . Wears glasses     Past Surgical History:  Procedure Laterality Date  . CESAREAN SECTION  12-23-2002  . CESAREAN SECTION N/A 04/07/2014   Procedure: CESAREAN SECTION;  Surgeon: Michael Litter, MD;  Location: [facility];  Service: Obstetrics;  Laterality: N/A;  . COLPOSCOPY    . LEEP N/A 09/14/2014   Procedure: LOOP ELECTROSURGICAL EXCISION PROCEDURE (LEEP);  Surgeon: Bernita Buffy. Duard Brady, MD;  Location: [facility];  Service: Gynecology;  Laterality: N/A;  . WISDOM TOOTH EXTRACTION  2012    Family History  Problem Relation Age of Onset  . Anesthesia problems Neg Hx   . Alcohol abuse Neg Hx     Social History  Substance Use Topics  . Smoking status: Never Smoker  . Smokeless tobacco: Never Used  . Alcohol use Yes     Comment: rarely    Allergies: No Known Allergies  Prescriptions Prior to Admission  Medication Sig Dispense Refill Last Dose  . ondansetron (ZOFRAN ODT) 4 MG disintegrating tablet Take 1 tablet (4 mg total) by mouth every 8 (eight) hours as needed for nausea or vomiting. 10 tablet 0   . promethazine (PHENERGAN) 25 MG tablet Take 1 tablet (25 mg total) by mouth every 6 (six) hours as needed for nausea or vomiting. 8 tablet 0   . SPRINTEC 28 0.25-35 MG-MCG tablet Take 1 tablet by mouth daily.  12 03/05/2015 at  Unknown time    Review of Systems  Constitutional: Negative.   Gastrointestinal: Positive for abdominal pain. Negative for diarrhea and vomiting.  Genitourinary: Negative.    Physical Exam   Blood pressure 116/70, pulse 71, temperature 99.2 F (37.3 C), temperature source Oral, resp. rate 16, last menstrual period 07/08/2016.  Physical Exam  Constitutional: She is oriented to person, place, and time. She appears well-developed and well-nourished.  HENT:  Head: Normocephalic and atraumatic.  Neck: Normal range of motion.  Cardiovascular: Normal rate.   Respiratory: Effort normal.  GI: Soft. She exhibits no distension. There is no tenderness. There is no rebound and no guarding.  Genitourinary:  Genitourinary Comments: External: no lesions Vagina: rugated, parous, no discharge Uterus: enlarged, anteverted, non tender, no CMT, closed/long  Musculoskeletal: Normal range of motion.  Neurological: She is alert and oriented to person, place, and time.  Skin: Skin is warm and dry.  Psychiatric: She has a normal mood and affect.   Bedside US: live IUP with FHR 150s, CRL [redacted]w[redacted]d  Results for orders placed or performed during the hospital encounter of 09/18/16 (from the past 24 hour(s))  Urinalysis, Routine w reflex microscopic (not at [facility])     Status: Abnormal   Collection Time:  09/18/16  2:15 PM  Result Value Ref Range   Color, Urine YELLOW YELLOW   APPearance CLEAR CLEAR   Specific Gravity, Urine 1.020 1.005 - 1.030   pH 7.0 5.0 - 8.0   Glucose, UA NEGATIVE NEGATIVE mg/dL   Hgb urine dipstick SMALL (A) NEGATIVE   Bilirubin Urine NEGATIVE NEGATIVE   Ketones, ur NEGATIVE NEGATIVE mg/dL   Protein, ur NEGATIVE NEGATIVE mg/dL   Nitrite NEGATIVE NEGATIVE   Leukocytes, UA NEGATIVE NEGATIVE  Urine microscopic-add on     Status: Abnormal   Collection Time: 09/18/16  2:15 PM  Result Value Ref Range   Squamous Epithelial / LPF 0-5 (A) NONE SEEN   WBC, UA 0-5 0 - 5 WBC/hpf   RBC / HPF  0-5 0 - 5 RBC/hpf   Bacteria, UA RARE (A) NONE SEEN  Pregnancy, urine POC     Status: Abnormal   Collection Time: 09/18/16  2:32 PM  Result Value Ref Range   Preg Test, Ur POSITIVE (A) NEGATIVE  CBC     Status: Abnormal   Collection Time: 09/18/16  2:34 PM  Result Value Ref Range   WBC 12.8 (H) 4.0 - 10.5 K/uL   RBC 3.68 (L) 3.87 - 5.11 MIL/uL   Hemoglobin 10.5 (L) 12.0 - 15.0 g/dL   HCT 16.131.0 (L) 09.636.0 - 04.546.0 %   MCV 84.2 78.0 - 100.0 fL   MCH 28.5 26.0 - 34.0 pg   MCHC 33.9 30.0 - 36.0 g/dL   RDW 40.914.8 81.111.5 - 91.415.5 %   Platelets 322 150 - 400 K/uL  hCG, quantitative, pregnancy     Status: Abnormal   Collection Time: 09/18/16  2:34 PM  Result Value Ref Range   hCG, Beta Chain, Quant, S 84,022 (H) <5 mIU/mL  Wet prep, genital     Status: Abnormal   Collection Time: 09/18/16  2:50 PM  Result Value Ref Range   Yeast Wet Prep HPF POC NONE SEEN NONE SEEN   Trich, Wet Prep NONE SEEN NONE SEEN   Clue Cells Wet Prep HPF POC PRESENT (A) NONE SEEN   WBC, Wet Prep HPF POC FEW (A) NONE SEEN   Sperm NONE SEEN    [place] Ob Comp Less 14 Wks  Result Date: 09/18/2016 CLINICAL DATA:  30 year old female with cramping in early pregnancy. Quantitative beta HCG 84,022. Estimated gestational age by LMP 10 weeks 2 days. Initial encounter. EXAM: OBSTETRIC <14 WK ULTRASOUND TECHNIQUE: Transabdominal ultrasound was performed for evaluation of the gestation as well as the maternal uterus and adnexal regions. COMPARISON:  None relevant. FINDINGS: Intrauterine gestational sac: Single Yolk sac:  Visible Embryo:  Visible Cardiac Activity: Detected Heart Rate: 166 bpm CRL:   41  mm   11 w 0 d                  US EDC: 04/09/2017 Subchorionic hemorrhage:  None visualized. Maternal uterus/adnexae: No pelvic free fluid. The right ovary appears normal measuring 3.5 x 3.2 x 2.6 cm. The left ovary appears normal measuring 2.4 x 2.7 x 2.3 cm. IMPRESSION: Single living IUP demonstrated. No acute maternal findings visualized.  Electronically Signed   By: Odessa FlemingH  Hall M.D.   On: 09/18/2016 16:15   MAU Course  Procedures  MDM Labs and US ordered and reviewed. Normal IUP. Cramping likely physiologic to pregnancy. Stable for discharge home.  Assessment and Plan   1. Normal intrauterine pregnancy on prenatal ultrasound, first trimester   2. Cramping affecting pregnancy, antepartum  3. Pelvic pain affecting pregnancy   4.      IDA  Discharge home Start PNV with Fe Follow up in 1 week at [facility] Discussed reasons to return to MAU  Donette Larry, CNM 09/18/2016, 2:43 PM

## 2016-09-19 LAB — GC/CHLAMYDIA PROBE AMP (~~LOC~~) NOT AT ARMC
Chlamydia: NEGATIVE
Neisseria Gonorrhea: NEGATIVE

## 2016-09-25 ENCOUNTER — Encounter: Payer: Self-pay | Admitting: Obstetrics and Gynecology

## 2016-09-25 ENCOUNTER — Ambulatory Visit (INDEPENDENT_AMBULATORY_CARE_PROVIDER_SITE_OTHER): Payer: Medicaid Other | Admitting: Obstetrics and Gynecology

## 2016-09-25 DIAGNOSIS — Z3482 Encounter for supervision of other normal pregnancy, second trimester: Secondary | ICD-10-CM

## 2016-09-25 DIAGNOSIS — O34219 Maternal care for unspecified type scar from previous cesarean delivery: Secondary | ICD-10-CM | POA: Diagnosis not present

## 2016-09-25 DIAGNOSIS — N879 Dysplasia of cervix uteri, unspecified: Secondary | ICD-10-CM

## 2016-09-25 DIAGNOSIS — Z3480 Encounter for supervision of other normal pregnancy, unspecified trimester: Secondary | ICD-10-CM | POA: Insufficient documentation

## 2016-09-25 NOTE — Progress Notes (Signed)
Patient states that she is overall feeling good. 

## 2016-09-25 NOTE — Progress Notes (Signed)
Subjective:    [name] is a [id number] [redacted]w[redacted]d being seen today for her first obstetrical visit.  Her obstetrical history is significant for previous cesarean section x 2. Patient does intend to breast feed. Pregnancy history fully reviewed.  Patient reports no complaints.  Vitals:   09/25/16 1028  BP: 109/72  Pulse: 71  Temp: 98.5 F (36.9 C)  Weight: 214 lb 3.2 oz (97.2 kg)    HISTORY: OB History  Gravida Para Term Preterm AB Living  4 2 2   1 2   SAB TAB Ectopic Multiple Live Births  1       2    # Outcome Date GA Lbr Len/2nd Weight Sex Delivery Anes PTL Lv  4 Current           3 Term 04/07/14 [redacted]w[redacted]d  8 lb 11.5 oz (3.955 kg) F CS-LTranv   LIV     Birth Comments: No problems at birth  2 SAB 10/31/11          1 Term 12/23/02   8 lb 12 oz (3.969 kg) M CS-LTranv  N LIV     Birth Comments: pushed- pt states 23hr     Past Medical History:  Diagnosis Date  . CIN III (cervical intraepithelial neoplasia III)   . History of kidney stones   . Wears glasses    Past Surgical History:  Procedure Laterality Date  . CESAREAN SECTION  12-23-2002  . CESAREAN SECTION N/A 04/07/2014   Procedure: CESAREAN SECTION;  Surgeon: Michael Litter, MD;  Location: [facility];  Service: Obstetrics;  Laterality: N/A;  . COLPOSCOPY    . LEEP N/A 09/14/2014   Procedure: LOOP ELECTROSURGICAL EXCISION PROCEDURE (LEEP);  Surgeon: Bernita Buffy. Duard Brady, MD;  Location: [facility];  Service: Gynecology;  Laterality: N/A;  . WISDOM TOOTH EXTRACTION  2012   Family History  Problem Relation Age of Onset  . Anesthesia problems Neg Hx   . Alcohol abuse Neg Hx      Exam    Uterus:     Pelvic Exam:    Perineum: No Hemorrhoids, Normal Perineum   Vulva: normal   Vagina:  normal mucosa, normal discharge   pH:    Cervix: closed and long   Adnexa: normal adnexa and no mass, fullness, tenderness   Bony Pelvis: gynecoid  System: Breast:  normal appearance, no masses or tenderness   Skin:  normal coloration and turgor, no rashes    Neurologic: oriented, no focal deficits   Extremities: normal strength, tone, and muscle mass   HEENT extra ocular movement intact   Mouth/Teeth mucous membranes moist, pharynx normal without lesions and dental hygiene good   Neck supple and no masses   Cardiovascular: regular rate and rhythm   Respiratory:  chest clear, no wheezing, crepitations, rhonchi, normal symmetric air entry   Abdomen: soft, non-tender; bowel sounds normal; no masses,  no organomegaly   Urinary:       Assessment:    Pregnancy: [id number] Patient Active Problem List   Diagnosis Date Noted  . Previous cesarean section complicating pregnancy, antepartum condition or complication 09/25/2016  . Supervision of other normal pregnancy, antepartum 09/25/2016  . Anxiety state, unspecified 04/06/2014  . 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  Needs colpo pp. 12/07/2013        Plan:     Initial labs drawn. Prenatal vitamins. Problem list reviewed and updated. Genetic Screening discussed Quad Screen: requested. Will be ordered at next visit  Ultrasound  discussed; fetal survey: requested. Will be ordered at next visit Patient declined flu vaccine today  Follow up in 4 weeks. 50% of 30 min visit spent on counseling and coordination of care.     Shylo Dillenbeck 09/25/2016

## 2016-09-25 NOTE — Patient Instructions (Signed)
Second Trimester of Pregnancy The second trimester is from week 13 through week 28, months 4 through 6. The second trimester is often a time when you feel your best. Your body has also adjusted to being pregnant, and you begin to feel better physically. Usually, morning sickness has lessened or quit completely, you may have more energy, and you may have an increase in appetite. The second trimester is also a time when the fetus is growing rapidly. At the end of the sixth month, the fetus is about 9 inches long and weighs about 1 pounds. You will likely begin to feel the baby move (quickening) between 18 and 20 weeks of the pregnancy. BODY CHANGES Your body goes through many changes during pregnancy. The changes vary from woman to woman.   Your weight will continue to increase. You will notice your lower abdomen bulging out.  You may begin to get stretch marks on your hips, abdomen, and breasts.  You may develop headaches that can be relieved by medicines approved by your health care provider.  You may urinate more often because the fetus is pressing on your bladder.  You may develop or continue to have heartburn as a result of your pregnancy.  You may develop constipation because certain hormones are causing the muscles that push waste through your intestines to slow down.  You may develop hemorrhoids or swollen, bulging veins (varicose veins).  You may have back pain because of the weight gain and pregnancy hormones relaxing your joints between the bones in your pelvis and as a result of a shift in weight and the muscles that support your balance.  Your breasts will continue to grow and be tender.  Your gums may bleed and may be sensitive to brushing and flossing.  Dark spots or blotches (chloasma, mask of pregnancy) may develop on your face. This will likely fade after the baby is born.  A dark line from your belly button to the pubic area (linea nigra) may appear. This will likely  fade after the baby is born.  You may have changes in your hair. These can include thickening of your hair, rapid growth, and changes in texture. Some women also have hair loss during or after pregnancy, or hair that feels dry or thin. Your hair will most likely return to normal after your baby is born. WHAT TO EXPECT AT YOUR PRENATAL VISITS During a routine prenatal visit:  You will be weighed to make sure you and the fetus are growing normally.  Your blood pressure will be taken.  Your abdomen will be measured to track your baby's growth.  The fetal heartbeat will be listened to.  Any test results from the previous visit will be discussed. Your health care provider may ask you:  How you are feeling.  If you are feeling the baby move.  If you have had any abnormal symptoms, such as leaking fluid, bleeding, severe headaches, or abdominal cramping.  If you are using any tobacco products, including cigarettes, chewing tobacco, and electronic cigarettes.  If you have any questions. Other tests that may be performed during your second trimester include:  Blood tests that check for:  Low iron levels (anemia).  Gestational diabetes (between 24 and 28 weeks).  Rh antibodies.  Urine tests to check for infections, diabetes, or protein in the urine.  An ultrasound to confirm the proper growth and development of the baby.  An amniocentesis to check for possible genetic problems.  Fetal screens for spina bifida   and Down syndrome.  HIV (human immunodeficiency virus) testing. Routine prenatal testing includes screening for HIV, unless you choose not to have this test. HOME CARE INSTRUCTIONS   Avoid all smoking, herbs, alcohol, and unprescribed drugs. These chemicals affect the formation and growth of the baby.  Do not use any tobacco products, including cigarettes, chewing tobacco, and electronic cigarettes. If you need help quitting, ask your health care provider. You may receive  counseling support and other resources to help you quit.  Follow your health care provider's instructions regarding medicine use. There are medicines that are either safe or unsafe to take during pregnancy.  Exercise only as directed by your health care provider. Experiencing uterine cramps is a good sign to stop exercising.  Continue to eat regular, healthy meals.  Wear a good support bra for breast tenderness.  Do not use hot tubs, steam rooms, or saunas.  Wear your seat belt at all times when driving.  Avoid raw meat, uncooked cheese, cat litter boxes, and soil used by cats. These carry germs that can cause birth defects in the baby.  Take your prenatal vitamins.  Take 1500-2000 mg of calcium daily starting at the 20th week of pregnancy until you deliver your baby.  Try taking a stool softener (if your health care provider approves) if you develop constipation. Eat more high-fiber foods, such as fresh vegetables or fruit and whole grains. Drink plenty of fluids to keep your urine clear or pale yellow.  Take warm sitz baths to soothe any pain or discomfort caused by hemorrhoids. Use hemorrhoid cream if your health care provider approves.  If you develop varicose veins, wear support hose. Elevate your feet for 15 minutes, 3-4 times a day. Limit salt in your diet.  Avoid heavy lifting, wear low heel shoes, and practice good posture.  Rest with your legs elevated if you have leg cramps or low back pain.  Visit your dentist if you have not gone yet during your pregnancy. Use a soft toothbrush to brush your teeth and be gentle when you floss.  A sexual relationship may be continued unless your health care provider directs you otherwise.  Continue to go to all your prenatal visits as directed by your health care provider. SEEK MEDICAL CARE IF:   You have dizziness.  You have mild pelvic cramps, pelvic pressure, or nagging pain in the abdominal area.  You have persistent nausea,  vomiting, or diarrhea.  You have a bad smelling vaginal discharge.  You have pain with urination. SEEK IMMEDIATE MEDICAL CARE IF:   You have a fever.  You are leaking fluid from your vagina.  You have spotting or bleeding from your vagina.  You have severe abdominal cramping or pain.  You have rapid weight gain or loss.  You have shortness of breath with chest pain.  You notice sudden or extreme swelling of your face, hands, ankles, feet, or legs.  You have not felt your baby move in over an hour.  You have severe headaches that do not go away with medicine.  You have vision changes.   This information is not intended to replace advice given to you by your health care provider. Make sure you discuss any questions you have with your health care provider.   Document Released: 12/10/2001 Document Revised: 01/06/2015 Document Reviewed: 02/16/2013 Elsevier Interactive Patient Education 2016 [employer]  Contraception Choices Contraception (birth control) is the use of any methods or devices to prevent pregnancy. Below are some methods to   help avoid pregnancy. HORMONAL METHODS   Contraceptive implant. This is a thin, plastic tube containing progesterone hormone. It does not contain estrogen hormone. Your health care provider inserts the tube in the inner part of the upper arm. The tube can remain in place for up to 3 years. After 3 years, the implant must be removed. The implant prevents the ovaries from releasing an egg (ovulation), thickens the cervical mucus to prevent sperm from entering the uterus, and thins the lining of the inside of the uterus.  Progesterone-only injections. These injections are given every 3 months by your health care provider to prevent pregnancy. This synthetic progesterone hormone stops the ovaries from releasing eggs. It also thickens cervical mucus and changes the uterine lining. This makes it harder for sperm to survive in the uterus.  Birth  control pills. These pills contain estrogen and progesterone hormone. They work by preventing the ovaries from releasing eggs (ovulation). They also cause the cervical mucus to thicken, preventing the sperm from entering the uterus. Birth control pills are prescribed by a health care provider.Birth control pills can also be used to treat heavy periods.  Minipill. This type of birth control pill contains only the progesterone hormone. They are taken every day of each month and must be prescribed by your health care provider.  Birth control patch. The patch contains hormones similar to those in birth control pills. It must be changed once a week and is prescribed by a health care provider.  Vaginal ring. The ring contains hormones similar to those in birth control pills. It is left in the vagina for 3 weeks, removed for 1 week, and then a new one is put back in place. The patient must be comfortable inserting and removing the ring from the vagina.A health care provider's prescription is necessary.  Emergency contraception. Emergency contraceptives prevent pregnancy after unprotected sexual intercourse. This pill can be taken right after sex or up to 5 days after unprotected sex. It is most effective the sooner you take the pills after having sexual intercourse. Most emergency contraceptive pills are available without a prescription. Check with your pharmacist. Do not use emergency contraception as your only form of birth control. BARRIER METHODS   Female condom. This is a thin sheath (latex or rubber) that is worn over the penis during sexual intercourse. It can be used with spermicide to increase effectiveness.  Female condom. This is a soft, loose-fitting sheath that is put into the vagina before sexual intercourse.  Diaphragm. This is a soft, latex, dome-shaped barrier that must be fitted by a health care provider. It is inserted into the vagina, along with a spermicidal jelly. It is inserted before  intercourse. The diaphragm should be left in the vagina for 6 to 8 hours after intercourse.  Cervical cap. This is a round, soft, latex or plastic cup that fits over the cervix and must be fitted by a health care provider. The cap can be left in place for up to 48 hours after intercourse.  Sponge. This is a soft, circular piece of polyurethane foam. The sponge has spermicide in it. It is inserted into the vagina after wetting it and before sexual intercourse.  Spermicides. These are chemicals that kill or block sperm from entering the cervix and uterus. They come in the form of creams, jellies, suppositories, foam, or tablets. They do not require a prescription. They are inserted into the vagina with an applicator before having sexual intercourse. The process must be repeated every   time you have sexual intercourse. INTRAUTERINE CONTRACEPTION  Intrauterine device (IUD). This is a T-shaped device that is put in a woman's uterus during a menstrual period to prevent pregnancy. There are 2 types:  Copper IUD. This type of IUD is wrapped in copper wire and is placed inside the uterus. Copper makes the uterus and fallopian tubes produce a fluid that kills sperm. It can stay in place for 10 years.  Hormone IUD. This type of IUD contains the hormone progestin (synthetic progesterone). The hormone thickens the cervical mucus and prevents sperm from entering the uterus, and it also thins the uterine lining to prevent implantation of a fertilized egg. The hormone can weaken or kill the sperm that get into the uterus. It can stay in place for 3-5 years, depending on which type of IUD is used. PERMANENT METHODS OF CONTRACEPTION  Female tubal ligation. This is when the woman's fallopian tubes are surgically sealed, tied, or blocked to prevent the egg from traveling to the uterus.  Hysteroscopic sterilization. This involves placing a small coil or insert into each fallopian tube. Your doctor uses a technique  called hysteroscopy to do the procedure. The device causes scar tissue to form. This results in permanent blockage of the fallopian tubes, so the sperm cannot fertilize the egg. It takes about 3 months after the procedure for the tubes to become blocked. You must use another form of birth control for these 3 months.  Female sterilization. This is when the female has the tubes that carry sperm tied off (vasectomy).This blocks sperm from entering the vagina during sexual intercourse. After the procedure, the man can still ejaculate fluid (semen). NATURAL PLANNING METHODS  Natural family planning. This is not having sexual intercourse or using a barrier method (condom, diaphragm, cervical cap) on days the woman could become pregnant.  Calendar method. This is keeping track of the length of each menstrual cycle and identifying when you are fertile.  Ovulation method. This is avoiding sexual intercourse during ovulation.  Symptothermal method. This is avoiding sexual intercourse during ovulation, using a thermometer and ovulation symptoms.  Post-ovulation method. This is timing sexual intercourse after you have ovulated. Regardless of which type or method of contraception you choose, it is important that you use condoms to protect against the transmission of sexually transmitted infections (STIs). Talk with your health care provider about which form of contraception is most appropriate for you.   This information is not intended to replace advice given to you by your health care provider. Make sure you discuss any questions you have with your health care provider.   Document Released: 12/16/2005 Document Revised: 12/21/2013 Document Reviewed: 06/10/2013 Elsevier Interactive Patient Education 2016 [employer]  Breastfeeding Deciding to breastfeed is one of the best choices you can make for you and your baby. A change in hormones during pregnancy causes your breast tissue to grow and increases the  number and size of your milk ducts. These hormones also allow proteins, sugars, and fats from your blood supply to make breast milk in your milk-producing glands. Hormones prevent breast milk from being released before your baby is born as well as prompt milk flow after birth. Once breastfeeding has begun, thoughts of your baby, as well as his or her sucking or crying, can stimulate the release of milk from your milk-producing glands.  BENEFITS OF BREASTFEEDING For Your Baby  Your first milk (colostrum) helps your baby's digestive system function better.  There are antibodies in your milk that help   your baby fight off infections.  Your baby has a lower incidence of asthma, allergies, and sudden infant death syndrome.  The nutrients in breast milk are better for your baby than infant formulas and are designed uniquely for your baby's needs.  Breast milk improves your baby's brain development.  Your baby is less likely to develop other conditions, such as childhood obesity, asthma, or type 2 diabetes mellitus. For You  Breastfeeding helps to create a very special bond between you and your baby.  Breastfeeding is convenient. Breast milk is always available at the correct temperature and costs nothing.  Breastfeeding helps to burn calories and helps you lose the weight gained during pregnancy.  Breastfeeding makes your uterus contract to its prepregnancy size faster and slows bleeding (lochia) after you give birth.   Breastfeeding helps to lower your risk of developing type 2 diabetes mellitus, osteoporosis, and breast or ovarian cancer later in life. SIGNS THAT YOUR BABY IS HUNGRY Early Signs of Hunger  Increased alertness or activity.  Stretching.  Movement of the head from side to side.  Movement of the head and opening of the mouth when the corner of the mouth or cheek is stroked (rooting).  Increased sucking sounds, smacking lips, cooing, sighing, or squeaking.  Hand-to-mouth  movements.  Increased sucking of fingers or hands. Late Signs of Hunger  Fussing.  Intermittent crying. Extreme Signs of Hunger Signs of extreme hunger will require calming and consoling before your baby will be able to breastfeed successfully. Do not wait for the following signs of extreme hunger to occur before you initiate breastfeeding:  Restlessness.  A loud, strong cry.  Screaming. BREASTFEEDING BASICS Breastfeeding Initiation  Find a comfortable place to sit or lie down, with your neck and back well supported.  Place a pillow or rolled up blanket under your baby to bring him or her to the level of your breast (if you are seated). Nursing pillows are specially designed to help support your arms and your baby while you breastfeed.  Make sure that your baby's abdomen is facing your abdomen.  Gently massage your breast. With your fingertips, massage from your chest wall toward your nipple in a circular motion. This encourages milk flow. You may need to continue this action during the feeding if your milk flows slowly.  Support your breast with 4 fingers underneath and your thumb above your nipple. Make sure your fingers are well away from your nipple and your baby's mouth.  Stroke your baby's lips gently with your finger or nipple.  When your baby's mouth is open wide enough, quickly bring your baby to your breast, placing your entire nipple and as much of the colored area around your nipple (areola) as possible into your baby's mouth.  More areola should be visible above your baby's upper lip than below the lower lip.  Your baby's tongue should be between his or her lower gum and your breast.  Ensure that your baby's mouth is correctly positioned around your nipple (latched). Your baby's lips should create a seal on your breast and be turned out (everted).  It is common for your baby to suck about 2-3 minutes in order to start the flow of breast milk. Latching Teaching  your baby how to latch on to your breast properly is very important. An improper latch can cause nipple pain and decreased milk supply for you and poor weight gain in your baby. Also, if your baby is not latched onto your nipple properly, he   or she may swallow some air during feeding. This can make your baby fussy. Burping your baby when you switch breasts during the feeding can help to get rid of the air. However, teaching your baby to latch on properly is still the best way to prevent fussiness from swallowing air while breastfeeding. Signs that your baby has successfully latched on to your nipple:  Silent tugging or silent sucking, without causing you pain.  Swallowing heard between every 3-4 sucks.  Muscle movement above and in front of his or her ears while sucking. Signs that your baby has not successfully latched on to nipple:  Sucking sounds or smacking sounds from your baby while breastfeeding.  Nipple pain. If you think your baby has not latched on correctly, slip your finger into the corner of your baby's mouth to break the suction and place it between your baby's gums. Attempt breastfeeding initiation again. Signs of Successful Breastfeeding Signs from your baby:  A gradual decrease in the number of sucks or complete cessation of sucking.  Falling asleep.  Relaxation of his or her body.  Retention of a small amount of milk in his or her mouth.  Letting go of your breast by himself or herself. Signs from you:  Breasts that have increased in firmness, weight, and size 1-3 hours after feeding.  Breasts that are softer immediately after breastfeeding.  Increased milk volume, as well as a change in milk consistency and color by the fifth day of breastfeeding.  Nipples that are not sore, cracked, or bleeding. Signs That Your Baby is Getting Enough Milk  Wetting at least 3 diapers in a 24-hour period. The urine should be clear and pale yellow by age 5 days.  At least 3  stools in a 24-hour period by age 5 days. The stool should be soft and yellow.  At least 3 stools in a 24-hour period by age 7 days. The stool should be seedy and yellow.  No loss of weight greater than 10% of birth weight during the first 3 days of age.  Average weight gain of 4-7 ounces (113-198 g) per week after age 4 days.  Consistent daily weight gain by age 5 days, without weight loss after the age of 2 weeks. After a feeding, your baby may spit up a small amount. This is common. BREASTFEEDING FREQUENCY AND DURATION Frequent feeding will help you make more milk and can prevent sore nipples and breast engorgement. Breastfeed when you feel the need to reduce the fullness of your breasts or when your baby shows signs of hunger. This is called "breastfeeding on demand." Avoid introducing a pacifier to your baby while you are working to establish breastfeeding (the first 4-6 weeks after your baby is born). After this time you may choose to use a pacifier. Research has shown that pacifier use during the first year of a baby's life decreases the risk of sudden infant death syndrome (SIDS). Allow your baby to feed on each breast as long as he or she wants. Breastfeed until your baby is finished feeding. When your baby unlatches or falls asleep while feeding from the first breast, offer the second breast. Because newborns are often sleepy in the first few weeks of life, you may need to awaken your baby to get him or her to feed. Breastfeeding times will vary from baby to baby. However, the following rules can serve as a guide to help you ensure that your baby is properly fed:  Newborns (babies 4 weeks   of age or younger) may breastfeed every 1-3 hours.  Newborns should not go longer than 3 hours during the day or 5 hours during the night without breastfeeding.  You should breastfeed your baby a minimum of 8 times in a 24-hour period until you begin to introduce solid foods to your baby at around 6  months of age. BREAST MILK PUMPING Pumping and storing breast milk allows you to ensure that your baby is exclusively fed your breast milk, even at times when you are unable to breastfeed. This is especially important if you are going back to work while you are still breastfeeding or when you are not able to be present during feedings. Your lactation consultant can give you guidelines on how long it is safe to store breast milk. A breast pump is a machine that allows you to pump milk from your breast into a sterile bottle. The pumped breast milk can then be stored in a refrigerator or freezer. Some breast pumps are operated by hand, while others use electricity. Ask your lactation consultant which type will work best for you. Breast pumps can be purchased, but some hospitals and breastfeeding support groups lease breast pumps on a monthly basis. A lactation consultant can teach you how to hand express breast milk, if you prefer not to use a pump. CARING FOR YOUR BREASTS WHILE YOU BREASTFEED Nipples can become dry, cracked, and sore while breastfeeding. The following recommendations can help keep your breasts moisturized and healthy:  Avoid using soap on your nipples.  Wear a supportive bra. Although not required, special nursing bras and tank tops are designed to allow access to your breasts for breastfeeding without taking off your entire bra or top. Avoid wearing underwire-style bras or extremely tight bras.  Air dry your nipples for 3-4minutes after each feeding.  Use only cotton bra pads to absorb leaked breast milk. Leaking of breast milk between feedings is normal.  Use lanolin on your nipples after breastfeeding. Lanolin helps to maintain your skin's normal moisture barrier. If you use pure lanolin, you do not need to wash it off before feeding your baby again. Pure lanolin is not toxic to your baby. You may also hand express a few drops of breast milk and gently massage that milk into your  nipples and allow the milk to air dry. In the first few weeks after giving birth, some women experience extremely full breasts (engorgement). Engorgement can make your breasts feel heavy, warm, and tender to the touch. Engorgement peaks within 3-5 days after you give birth. The following recommendations can help ease engorgement:  Completely empty your breasts while breastfeeding or pumping. You may want to start by applying warm, moist heat (in the shower or with warm water-soaked hand towels) just before feeding or pumping. This increases circulation and helps the milk flow. If your baby does not completely empty your breasts while breastfeeding, pump any extra milk after he or she is finished.  Wear a snug bra (nursing or regular) or tank top for 1-2 days to signal your body to slightly decrease milk production.  Apply ice packs to your breasts, unless this is too uncomfortable for you.  Make sure that your baby is latched on and positioned properly while breastfeeding. If engorgement persists after 48 hours of following these recommendations, contact your health care provider or a lactation consultant. OVERALL HEALTH CARE RECOMMENDATIONS WHILE BREASTFEEDING  Eat healthy foods. Alternate between meals and snacks, eating 3 of each per day. Because what   you eat affects your breast milk, some of the foods may make your baby more irritable than usual. Avoid eating these foods if you are sure that they are negatively affecting your baby.  Drink milk, fruit juice, and water to satisfy your thirst (about 10 glasses a day).  Rest often, relax, and continue to take your prenatal vitamins to prevent fatigue, stress, and anemia.  Continue breast self-awareness checks.  Avoid chewing and smoking tobacco. Chemicals from cigarettes that pass into breast milk and exposure to secondhand smoke may harm your baby.  Avoid alcohol and drug use, including marijuana. Some medicines that may be harmful to your  baby can pass through breast milk. It is important to ask your health care provider before taking any medicine, including all over-the-counter and prescription medicine as well as vitamin and herbal supplements. It is possible to become pregnant while breastfeeding. If birth control is desired, ask your health care provider about options that will be safe for your baby. SEEK MEDICAL CARE IF:  You feel like you want to stop breastfeeding or have become frustrated with breastfeeding.  You have painful breasts or nipples.  Your nipples are cracked or bleeding.  Your breasts are red, tender, or warm.  You have a swollen area on either breast.  You have a fever or chills.  You have nausea or vomiting.  You have drainage other than breast milk from your nipples.  Your breasts do not become full before feedings by the fifth day after you give birth.  You feel sad and depressed.  Your baby is too sleepy to eat well.  Your baby is having trouble sleeping.   Your baby is wetting less than 3 diapers in a 24-hour period.  Your baby has less than 3 stools in a 24-hour period.  Your baby's skin or the white part of his or her eyes becomes yellow.   Your baby is not gaining weight by 5 days of age. SEEK IMMEDIATE MEDICAL CARE IF:  Your baby is overly tired (lethargic) and does not want to wake up and feed.  Your baby develops an unexplained fever.   This information is not intended to replace advice given to you by your health care provider. Make sure you discuss any questions you have with your health care provider.   Document Released: 12/16/2005 Document Revised: 09/06/2015 Document Reviewed: 06/09/2013 Elsevier Interactive Patient Education 2016 [employer] Inc[employer]  

## 2016-09-27 LAB — GC/CHLAMYDIA PROBE AMP
Chlamydia trachomatis, NAA: NEGATIVE
Neisseria gonorrhoeae by PCR: NEGATIVE

## 2016-09-27 LAB — CULTURE, OB URINE

## 2016-09-27 LAB — URINE CULTURE, OB REFLEX

## 2016-10-02 LAB — PAP IG W/ RFLX HPV ASCU: PAP Smear Comment: 0

## 2016-10-03 LAB — HEMOGLOBINOPATHY EVALUATION
HGB C: 0 %
HGB S: 0 %
Hemoglobin A2 Quantitation: 2.4 % (ref 0.7–3.1)
Hemoglobin F Quantitation: 0 % (ref 0.0–2.0)
Hgb A: 97.6 % (ref 94.0–98.0)

## 2016-10-03 LAB — PRENATAL PROFILE I(LABCORP)
Antibody Screen: NEGATIVE
Basophils Absolute: 0 10*3/uL (ref 0.0–0.2)
Basos: 0 %
EOS (ABSOLUTE): 0.3 10*3/uL (ref 0.0–0.4)
Eos: 3 %
Hematocrit: 35.5 % (ref 34.0–46.6)
Hemoglobin: 11.4 g/dL (ref 11.1–15.9)
Hepatitis B Surface Ag: NEGATIVE
Immature Grans (Abs): 0 10*3/uL (ref 0.0–0.1)
Immature Granulocytes: 0 %
Lymphocytes Absolute: 3.1 10*3/uL (ref 0.7–3.1)
Lymphs: 24 %
MCH: 28.2 pg (ref 26.6–33.0)
MCHC: 32.1 g/dL (ref 31.5–35.7)
MCV: 88 fL (ref 79–97)
Monocytes Absolute: 0.8 10*3/uL (ref 0.1–0.9)
Monocytes: 6 %
Neutrophils Absolute: 8.6 10*3/uL — ABNORMAL HIGH (ref 1.4–7.0)
Neutrophils: 67 %
Platelets: 399 10*3/uL — ABNORMAL HIGH (ref 150–379)
RBC: 4.04 x10E6/uL (ref 3.77–5.28)
RDW: 15.4 % (ref 12.3–15.4)
RPR Ser Ql: NONREACTIVE
Rh Factor: POSITIVE
Rubella Antibodies, IGG: 2.57 {index}
WBC: 12.8 10*3/uL — ABNORMAL HIGH (ref 3.4–10.8)

## 2016-10-03 LAB — CYSTIC FIBROSIS MUTATION 97: Interpretation: NOT DETECTED

## 2016-10-03 LAB — VARICELLA ZOSTER ANTIBODY, IGG: Varicella zoster IgG: 2529 {index}

## 2016-10-03 LAB — TOXASSURE SELECT 13 (MW), URINE

## 2016-10-03 LAB — HIV ANTIBODY (ROUTINE TESTING W REFLEX): HIV Screen 4th Generation wRfx: NONREACTIVE

## 2016-10-23 ENCOUNTER — Ambulatory Visit (INDEPENDENT_AMBULATORY_CARE_PROVIDER_SITE_OTHER): Payer: Medicaid Other | Admitting: Obstetrics and Gynecology

## 2016-10-23 VITALS — BP 136/77 | HR 74 | Temp 98.1°F | Wt 219.4 lb

## 2016-10-23 DIAGNOSIS — O34219 Maternal care for unspecified type scar from previous cesarean delivery: Secondary | ICD-10-CM | POA: Diagnosis not present

## 2016-10-23 DIAGNOSIS — Z3480 Encounter for supervision of other normal pregnancy, unspecified trimester: Secondary | ICD-10-CM

## 2016-10-23 DIAGNOSIS — Z3482 Encounter for supervision of other normal pregnancy, second trimester: Secondary | ICD-10-CM | POA: Diagnosis not present

## 2016-10-23 DIAGNOSIS — R87613 High grade squamous intraepithelial lesion on cytologic smear of cervix (HGSIL): Secondary | ICD-10-CM

## 2016-10-23 DIAGNOSIS — N879 Dysplasia of cervix uteri, unspecified: Secondary | ICD-10-CM

## 2016-10-23 NOTE — Progress Notes (Signed)
Patient is in office for appt, reports fatigue and feeling fetal flutters.

## 2016-10-23 NOTE — Progress Notes (Signed)
Subjective:  [name] is a 30 y.o. [id number] at 8947w0d being seen today for ongoing prenatal care.  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  Needs colpo pp.; Anxiety state, unspecified; Previous cesarean section complicating pregnancy, antepartum condition or complication; and Supervision of other normal pregnancy, antepartum on her problem list.  Patient reports fatigue.  Contractions: Not present. Vag. Bleeding: None.  Movement: Present. Denies leaking of fluid.   The following portions of the patient's history were reviewed and updated as appropriate: allergies, current medications, past family history, past medical history, past social history, past surgical history and problem list. Problem list updated.  Objective:   Vitals:   10/23/16 1602  BP: 136/77  Pulse: 74  Temp: 98.1 F (36.7 C)  Weight: 219 lb 6.4 oz (99.5 kg)    Fetal Status: Fetal Heart Rate (bpm): 150   Movement: Present     General:  Alert, oriented and cooperative. Patient is in no acute distress.  Skin: Skin is warm and dry. No rash noted.   Cardiovascular: Normal heart rate noted  Respiratory: Normal respiratory effort, no problems with respiration noted  Abdomen: Soft, gravid, appropriate for gestational age. Pain/Pressure: Absent     Pelvic:  Cervical exam deferred        Extremities: Normal range of motion.  Edema: None  Mental Status: Normal mood and affect. Normal behavior. Normal judgment and thought content.   Urinalysis:      Assessment and Plan:  Pregnancy: [id number] at 8047w0d  1. Supervision of other normal pregnancy, antepartum  - AFP, Quad Screen - US MFM OB COMP + 14 WK; Future  2. 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  Needs colpo pp. Follow up PP  3. Previous cesarean section complicating pregnancy, antepartum condition or complication Desires repeat LTCS and BTL Will nee to sign BTL papers at 28 30 wk  visit  Preterm labor symptoms and general obstetric precautions including but not limited to vaginal bleeding, contractions, leaking of fluid and fetal movement were reviewed in detail with the patient. Please refer to After Visit Summary for other counseling recommendations.  No Follow-up on file.   Hermina StaggersMichael L Lovetta Condie, MD

## 2016-10-23 NOTE — Patient Instructions (Signed)

## 2016-10-25 ENCOUNTER — Encounter (HOSPITAL_COMMUNITY): Payer: Self-pay | Admitting: Obstetrics and Gynecology

## 2016-10-25 LAB — AFP, QUAD SCREEN
DIA Mom Value: 0.45
DIA Value (EIA): 65.94 pg/mL
DSR (By Age)    1 IN: 477
DSR (Second Trimester) 1 IN: 5307
Gestational Age: 16 WEEKS
MSAFP Mom: 0.96
MSAFP: 27.3 ng/mL
MSHCG Mom: 1.56
MSHCG: 48920 m[IU]/mL
Maternal Age At EDD: 32.6 YEARS
Osb Risk: 10000
T18 (By Age): 1:1856 {titer}
Test Results:: NEGATIVE
Weight: 219 [lb_av]
uE3 Mom: 0.78
uE3 Value: 0.58 ng/mL

## 2016-10-28 ENCOUNTER — Encounter (HOSPITAL_COMMUNITY): Payer: Self-pay | Admitting: Obstetrics and Gynecology

## 2016-11-04 ENCOUNTER — Telehealth: Payer: Self-pay | Admitting: *Deleted

## 2016-11-04 NOTE — Telephone Encounter (Signed)
Call to patient-she is itching in her torso region. She reports no rash. Benadryl helps some- but she is scratching like crazy at times. Appointment given for evaluation.

## 2016-11-04 NOTE — Telephone Encounter (Signed)
Pt called in and stated she is itching really bad was told she could take Benadryl but was told she may need to be checked out because it may be some type 2nd trimester rash.. Please Advise.Marland Kitchen..Marland Kitchen

## 2016-11-05 ENCOUNTER — Ambulatory Visit (INDEPENDENT_AMBULATORY_CARE_PROVIDER_SITE_OTHER): Payer: Medicaid Other | Admitting: Certified Nurse Midwife

## 2016-11-05 VITALS — BP 112/49 | HR 70 | Wt 218.0 lb

## 2016-11-05 DIAGNOSIS — Z3482 Encounter for supervision of other normal pregnancy, second trimester: Secondary | ICD-10-CM

## 2016-11-05 DIAGNOSIS — L309 Dermatitis, unspecified: Secondary | ICD-10-CM

## 2016-11-05 DIAGNOSIS — Z3480 Encounter for supervision of other normal pregnancy, unspecified trimester: Secondary | ICD-10-CM

## 2016-11-05 MED ORDER — TRIAMCINOLONE ACETONIDE 0.5 % EX OINT: 1.0000 "application " | TOPICAL_OINTMENT | Freq: Two times a day (BID) | CUTANEOUS | 99 refills | Status: DC

## 2016-11-05 NOTE — Progress Notes (Signed)
  Subjective:    Leeanne RioJamil D Mapp is a 30 y.o. female being seen today for her obstetrical visit. She is at 1145w6d gestation. Patient reports: no bleeding, no contractions, no cramping, no leaking and itching of breasts, abdomen, hands: no visible rash.  Is taking hot showers, not using lotion and not drinking adequate amounts of water.  Encouraged to increase her hydration, use copious amounts of lotion and take benadryl as needed for the itching.  If not improved in several weeks would consider LFTs/CMP. Discussed with patient. .  Problem List Items Addressed This Visit      Other   Supervision of other normal pregnancy, antepartum    Other Visit Diagnoses    Eczema, unspecified type    -  Primary   Relevant Medications   triamcinolone ointment (KENALOG) 0.5 %     Patient Active Problem List   Diagnosis Date Noted  . Previous cesarean section complicating pregnancy, antepartum condition or complication 09/25/2016  . Supervision of other normal pregnancy, antepartum 09/25/2016  . Anxiety state, unspecified 04/06/2014  . 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  Needs colpo pp. 12/07/2013    Objective:     BP (!) 112/49   Pulse 70   Wt 218 lb (98.9 kg)   LMP 07/08/2016   BMI 36.28 kg/m  Uterine Size: Below umbilicus   FHR: 141 by doppler  Assessment:    Pregnancy @ 345w6d  weeks Doing well    H/O eczema   Plan:    Problem list reviewed and updated. Labs reviewed.  Follow up in 4 weeks. FIRST/CF mutation testing/NIPT/QUAD SCREEN/fragile X/Ashkenazi Jewish population testing/Spinal muscular atrophy discussed: results reviewed. Role of ultrasound in pregnancy discussed; fetal survey: ordered. Amniocentesis discussed: not indicated. 50% of 15 minute visit spent on counseling and coordination of care.

## 2016-11-05 NOTE — Progress Notes (Signed)
Pt states that she is having some itching on her breast and abdomen area.

## 2016-11-06 ENCOUNTER — Other Ambulatory Visit: Payer: Self-pay | Admitting: Obstetrics and Gynecology

## 2016-11-06 ENCOUNTER — Ambulatory Visit (HOSPITAL_COMMUNITY)
Admission: RE | Admit: 2016-11-06 | Discharge: 2016-11-06 | Disposition: A | Discharge status: Home / Self Care | Payer: Medicaid Other | Source: Ambulatory Visit | Attending: Obstetrics and Gynecology | Admitting: Obstetrics and Gynecology

## 2016-11-06 ENCOUNTER — Ambulatory Visit (HOSPITAL_COMMUNITY): Payer: Medicaid Other

## 2016-11-06 DIAGNOSIS — O34219 Maternal care for unspecified type scar from previous cesarean delivery: Secondary | ICD-10-CM

## 2016-11-06 DIAGNOSIS — Z3480 Encounter for supervision of other normal pregnancy, unspecified trimester: Secondary | ICD-10-CM

## 2016-11-06 DIAGNOSIS — O99212 Obesity complicating pregnancy, second trimester: Secondary | ICD-10-CM | POA: Diagnosis not present

## 2016-11-06 DIAGNOSIS — Z363 Encounter for antenatal screening for malformations: Secondary | ICD-10-CM | POA: Insufficient documentation

## 2016-11-06 DIAGNOSIS — Z3A18 18 weeks gestation of pregnancy: Secondary | ICD-10-CM

## 2016-11-20 ENCOUNTER — Ambulatory Visit (INDEPENDENT_AMBULATORY_CARE_PROVIDER_SITE_OTHER): Payer: Medicaid Other | Admitting: Obstetrics & Gynecology

## 2016-11-20 DIAGNOSIS — Z3482 Encounter for supervision of other normal pregnancy, second trimester: Secondary | ICD-10-CM

## 2016-11-20 DIAGNOSIS — Z3480 Encounter for supervision of other normal pregnancy, unspecified trimester: Secondary | ICD-10-CM

## 2016-11-20 NOTE — Patient Instructions (Signed)
How a Baby Grows During Pregnancy Introduction Pregnancy begins when a female's sperm enters a female's egg (fertilization). This happens in one of the tubes (fallopian tubes) that connect the ovaries to the womb (uterus). The fertilized egg is called an embryo until it reaches 10 weeks. From 10 weeks until birth, it is called a fetus. The fertilized egg moves down the fallopian tube to the uterus. Then it implants into the lining of the uterus and begins to grow. The developing fetus receives oxygen and nutrients through the pregnant woman's bloodstream and the tissues that grow (placenta) to support the fetus. The placenta is the life support system for the fetus. It provides nutrition and removes waste. Learning as much as you can about your pregnancy and how your baby is developing can help you enjoy the experience. It can also make you aware of when there might be a problem and when to ask questions. How long does a typical pregnancy last? A pregnancy usually lasts 280 days, or about 40 weeks. Pregnancy is divided into three trimesters:  First trimester: 0-13 weeks.  Second trimester: 14-27 weeks.  Third trimester: 28-40 weeks. The day when your baby is considered ready to be born (full term) is your estimated date of delivery. How does my baby develop month by month? First month  The fertilized egg attaches to the inside of the uterus.  Some cells will form the placenta. Others will form the fetus.  The arms, legs, brain, spinal cord, lungs, and heart begin to develop.  At the end of the first month, the heart begins to beat. Second month  The bones, inner ear, eyelids, hands, and feet form.  The genitals develop.  By the end of 8 weeks, all major organs are developing. Third month  All of the internal organs are forming.  Teeth develop below the gums.  Bones and muscles begin to grow. The spine can flex.  The skin is transparent.  Fingernails and toenails begin to  form.  Arms and legs continue to grow longer, and hands and feet develop.  The fetus is about 3 in (7.6 cm) long. Fourth month  The placenta is completely formed.  The external sex organs, neck, outer ear, eyebrows, eyelids, and fingernails are formed.  The fetus can hear, swallow, and move its arms and legs.  The kidneys begin to produce urine.  The skin is covered with a white waxy coating (vernix) and very fine hair (lanugo). Fifth month  The fetus moves around more and can be felt for the first time (quickening).  The fetus starts to sleep and wake up and may begin to suck its finger.  The nails grow to the end of the fingers.  The organ in the digestive system that makes bile (gallbladder) functions and helps to digest the nutrients.  If your baby is a girl, eggs are present in her ovaries. If your baby is a boy, testicles start to move down into his scrotum. Sixth month  The lungs are formed, but the fetus is not yet able to breathe.  The eyes open. The brain continues to develop.  Your baby has fingerprints and toe prints. Your baby's hair grows thicker.  At the end of the second trimester, the fetus is about 9 in (22.9 cm) long. Seventh month  The fetus kicks and stretches.  The eyes are developed enough to sense changes in light.  The hands can make a grasping motion.  The fetus responds to sound. Eighth month    All organs and body systems are fully developed and functioning.  Bones harden and taste buds develop. The fetus may hiccup.  Certain areas of the brain are still developing. The skull remains soft. Ninth month  The fetus gains about  lb (0.23 kg) each week.  The lungs are fully developed.  Patterns of sleep develop.  The fetus's head typically moves into a head-down position (vertex) in the uterus to prepare for birth. If the buttocks move into a vertex position instead, the baby is breech.  The fetus weighs 6-9 lbs (2.72-4.08 kg) and is  19-20 in (48.26-50.8 cm) long. What can I do to have a healthy pregnancy and help my baby develop?  Eating and Drinking  Eat a healthy diet.  Talk with your health care provider to make sure that you are getting the nutrients that you and your baby need.  Visit www.choosemyplate.gov to learn about creating a healthy diet.  Gain a healthy amount of weight during pregnancy as advised by your health care provider. This is usually 25-35 pounds. You may need to:  Gain more if you were underweight before getting pregnant or if you are pregnant with more than one baby.  Gain less if you were overweight or obese when you got pregnant. Medicines and Vitamins  Take prenatal vitamins as directed by your health care provider. These include vitamins such as folic acid, iron, calcium, and vitamin D. They are important for healthy development.  Take medicines only as directed by your health care provider. Read labels and ask a pharmacist or your health care provider whether over-the-counter medicines, supplements, and prescription drugs are safe to take during pregnancy. Activities  Be physically active as advised by your health care provider. Ask your health care provider to recommend activities that are safe for you to do, such as walking or swimming.  Do not participate in strenuous or extreme sports.  Lifestyle  Do not drink alcohol.  Do not use any tobacco products, including cigarettes, chewing tobacco, or electronic cigarettes. If you need help quitting, ask your health care provider.  Do not use illegal drugs. Safety  Avoid exposure to mercury, lead, or other heavy metals. Ask your health care provider about common sources of these heavy metals.  Avoid listeria infection during pregnancy. Follow these precautions:  Do not eat soft cheeses or deli meats.  Do not eat hot dogs unless they have been warmed up to the point of steaming, such as in the microwave oven.  Do not drink  unpasteurized milk.  Avoid toxoplasmosis infection during pregnancy. Follow these precautions:  Do not change your cat's litter box, if you have a cat. Ask someone else to do this for you.  Wear gardening gloves while working in the yard. General Instructions  Keep all follow-up visits as directed by your health care provider. This is important. This includes prenatal care and screening tests.  Manage any chronic health conditions. Work closely with your health care provider to keep conditions, such as diabetes, under control. How do I know if my baby is developing well? At each prenatal visit, your health care provider will do several different tests to check on your health and keep track of your baby's development. These include:  Fundal height.  Your health care provider will measure your growing belly from top to bottom using a tape measure.  Your health care provider will also feel your belly to determine your baby's position.  Heartbeat.  An ultrasound in the first trimester   can confirm pregnancy and show a heartbeat, depending on how far along you are.  Your health care provider will check your baby's heart rate at every prenatal visit.  As you get closer to your delivery date, you may have regular fetal heart rate monitoring to make sure that your baby is not in distress.  Second trimester ultrasound.  This ultrasound checks your baby's development. It also indicates your baby's gender. What should I do if I have concerns about my baby's development? Always talk with your health care provider about any concerns that you may have. This information is not intended to replace advice given to you by your health care provider. Make sure you discuss any questions you have with your health care provider. Document Released: 06/03/2008 Document Revised: 05/23/2016 Document Reviewed: 05/25/2014  2017 Elsevier  

## 2016-11-20 NOTE — Progress Notes (Signed)
   PRENATAL VISIT NOTE  Subjective:  [name] is a 30 y.o. [id number] at 5957w0d being seen today for ongoing prenatal care.  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  Needs colpo pp.; Anxiety state, unspecified; Previous cesarean section complicating pregnancy, antepartum condition or complication; and Supervision of other normal pregnancy, antepartum on her problem list.  Patient reports no complaints.  Contractions: Not present. Vag. Bleeding: None.  Movement: Present. Denies leaking of fluid.   The following portions of the patient's history were reviewed and updated as appropriate: allergies, current medications, past family history, past medical history, past social history, past surgical history and problem list. Problem list updated.  Objective:   Vitals:   11/20/16 0833  BP: 107/63  Pulse: 71  Temp: 98 F (36.7 C)  Weight: 217 lb 6.4 oz (98.6 kg)    Fetal Status: Fetal Heart Rate (bpm): 148   Movement: Present     General:  Alert, oriented and cooperative. Patient is in no acute distress.  Skin: Skin is warm and dry. No rash noted.   Cardiovascular: Normal heart rate noted  Respiratory: Normal respiratory effort, no problems with respiration noted  Abdomen: Soft, gravid, appropriate for gestational age. Pain/Pressure: Present     Pelvic:  Cervical exam deferred        Extremities: Normal range of motion.  Edema: None  Mental Status: Normal mood and affect. Normal behavior. Normal judgment and thought content.   Assessment and Plan:  Pregnancy: [id number] at 9757w0d  1. Supervision of other normal pregnancy, antepartum US report reviewed   Preterm labor symptoms and general obstetric precautions including but not limited to vaginal bleeding, contractions, leaking of fluid and fetal movement were reviewed in detail with the patient. Please refer to After Visit Summary for other counseling  recommendations.  Return in about 4 weeks (around 12/18/2016).   Adam PhenixJames G Myisha Pickerel, MD

## 2016-11-20 NOTE — Progress Notes (Signed)
Patient states that she feels good today. 

## 2016-12-16 ENCOUNTER — Ambulatory Visit (INDEPENDENT_AMBULATORY_CARE_PROVIDER_SITE_OTHER): Payer: Medicaid Other | Admitting: Obstetrics and Gynecology

## 2016-12-16 VITALS — BP 99/63 | HR 93 | Temp 98.8°F | Wt 220.0 lb

## 2016-12-16 DIAGNOSIS — N879 Dysplasia of cervix uteri, unspecified: Secondary | ICD-10-CM

## 2016-12-16 DIAGNOSIS — Z3482 Encounter for supervision of other normal pregnancy, second trimester: Secondary | ICD-10-CM

## 2016-12-16 DIAGNOSIS — O34219 Maternal care for unspecified type scar from previous cesarean delivery: Secondary | ICD-10-CM

## 2016-12-16 DIAGNOSIS — Z3480 Encounter for supervision of other normal pregnancy, unspecified trimester: Secondary | ICD-10-CM

## 2016-12-16 NOTE — Progress Notes (Signed)
Pt c/o body aches and pains. Pt states she feels like she has a fever 98.8 today. Declines flu vaccine. Denies sneezing/coughing.

## 2016-12-16 NOTE — Progress Notes (Signed)
   PRENATAL VISIT NOTE  Subjective:  [name] is a 30 y.o. [phone] at 6813w5d being seen today for ongoing prenatal care.  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  Needs colpo pp.; Anxiety state, unspecified; Previous cesarean section complicating pregnancy, antepartum condition or complication; and Supervision of other normal pregnancy, antepartum on her problem list.  Patient reports body aches but no upper respiratory symptoms.  Contractions: Not present. Vag. Bleeding: None.  Movement: Present. Denies leaking of fluid.   The following portions of the patient's history were reviewed and updated as appropriate: allergies, current medications, past family history, past medical history, past social history, past surgical history and problem list. Problem list updated.  Objective:   Vitals:   12/16/16 1556  BP: 99/63  Pulse: 93  Temp: 98.8 F (37.1 C)  Weight: 220 lb (99.8 kg)    Fetal Status: Fetal Heart Rate (bpm): 135 Fundal Height: 23 cm Movement: Present     General:  Alert, oriented and cooperative. Patient is in no acute distress.  Skin: Skin is warm and dry. No rash noted.   Cardiovascular: Normal heart rate noted  Respiratory: Normal respiratory effort, no problems with respiration noted  Abdomen: Soft, gravid, appropriate for gestational age. Pain/Pressure: Present     Pelvic:  Cervical exam deferred        Extremities: Normal range of motion.  Edema: None  Mental Status: Normal mood and affect. Normal behavior. Normal judgment and thought content.   Assessment and Plan:  Pregnancy: [id number] at 6313w5d  1. 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  Needs colpo pp. Will address pp  2. Supervision of other normal pregnancy, antepartum Reviewed OTC medications safe with pregnancy Supportive care measures reviewed glucola at next visit  3. Previous cesarean section complicating  pregnancy, antepartum condition or complication Will be scheduled for repeat with BTL at 39 weeks BTL consent to be signed at next visit  Preterm labor symptoms and general obstetric precautions including but not limited to vaginal bleeding, contractions, leaking of fluid and fetal movement were reviewed in detail with the patient. Please refer to After Visit Summary for other counseling recommendations.  Return in about 4 weeks (around 01/13/2017) for rob and 2 hr glucola.   Catalina AntiguaPeggy Samone Guhl, MD

## 2016-12-23 ENCOUNTER — Inpatient Hospital Stay (HOSPITAL_COMMUNITY)
Admission: AD | Admit: 2016-12-23 | Discharge: 2016-12-23 | Disposition: A | Discharge status: Home / Self Care | Payer: Medicaid Other | Source: Ambulatory Visit | Attending: Obstetrics & Gynecology | Admitting: Obstetrics & Gynecology

## 2016-12-23 ENCOUNTER — Encounter (HOSPITAL_COMMUNITY): Payer: Self-pay | Admitting: *Deleted

## 2016-12-23 DIAGNOSIS — Z3A24 24 weeks gestation of pregnancy: Secondary | ICD-10-CM | POA: Insufficient documentation

## 2016-12-23 DIAGNOSIS — R102 Pelvic and perineal pain: Secondary | ICD-10-CM | POA: Diagnosis not present

## 2016-12-23 DIAGNOSIS — Z3480 Encounter for supervision of other normal pregnancy, unspecified trimester: Secondary | ICD-10-CM

## 2016-12-23 DIAGNOSIS — O26892 Other specified pregnancy related conditions, second trimester: Secondary | ICD-10-CM | POA: Diagnosis not present

## 2016-12-23 DIAGNOSIS — Z79899 Other long term (current) drug therapy: Secondary | ICD-10-CM | POA: Diagnosis not present

## 2016-12-23 DIAGNOSIS — O34219 Maternal care for unspecified type scar from previous cesarean delivery: Secondary | ICD-10-CM

## 2016-12-23 DIAGNOSIS — N750 Cyst of Bartholin's gland: Secondary | ICD-10-CM | POA: Diagnosis not present

## 2016-12-23 LAB — URINALYSIS, ROUTINE W REFLEX MICROSCOPIC
Bilirubin Urine: NEGATIVE
Glucose, UA: NEGATIVE mg/dL
Ketones, ur: 15 mg/dL — AB
Nitrite: NEGATIVE
Protein, ur: 30 mg/dL — AB
Specific Gravity, Urine: 1.025 (ref 1.005–1.030)
pH: 6.5 (ref 5.0–8.0)

## 2016-12-23 LAB — URINALYSIS, MICROSCOPIC (REFLEX)

## 2016-12-23 NOTE — Discharge Instructions (Signed)
Bartholin Cyst or Abscess °Introduction °A Bartholin cyst is a fluid-filled sac that forms on a Bartholin gland. Bartholin glands are small glands that are found in the folds of skin (labia) on the sides of the lower opening of the vagina. °This type of cyst causes a bulge on the side of the vagina. A cyst that is not large or infected may not cause problems. However, if the fluid in the cyst becomes infected, the cyst can turn into an abscess. An abscess may cause discomfort or pain. °Follow these instructions at home: °· Take medicines only as told by your doctor. °· If you were prescribed an antibiotic medicine, finish all of it even if you start to feel better. °· Apply warm, wet compresses to the area or take warm, shallow baths that cover your pelvic area (sitz baths). Do this many times each day or as told by your doctor. °· Do not squeeze the cyst. Do not apply heavy pressure to it. °· Do not have sex until the cyst has gone away. °· If your cyst or abscess was opened by your doctor, a small piece of gauze or a drain may have been placed in the area. That lets the cyst drain. Do not remove the gauze or the drain until your doctor tells you it is okay to do that. °· Do not wear tampons. Wear feminine pads as needed for any fluid or blood. °· Keep all follow-up visits as told by your doctor. This is important. °Contact a doctor if: °· Your pain, puffiness (swelling), or redness in the area of the cyst gets worse. °· You have fluid or pus pus coming from the cyst. °· You have a fever. °This information is not intended to replace advice given to you by your health care provider. Make sure you discuss any questions you have with your health care provider. °Document Released: 03/14/2009 Document Revised: 05/23/2016 Document Reviewed: 08/01/2014 °© 2017 Elsevier ° °
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
## 2016-12-23 NOTE — MAU Note (Signed)
Pt has pain between her vagina and buttocks that radiates towards the back.  Pt states she feels a bump.

## 2016-12-24 ENCOUNTER — Telehealth: Payer: Self-pay

## 2016-12-24 NOTE — Telephone Encounter (Signed)
Called pt to schedule follow up appointment- per [name] for bartholin cyst. Pt voicemail is not set up and could not leave message.

## 2016-12-25 ENCOUNTER — Telehealth: Payer: Self-pay

## 2016-12-25 NOTE — Telephone Encounter (Signed)
Pt states the bartholin cyst has burst and she feel a lot better and do not need appt. But will call if it return and ask to speak to me so that I can add her to schedule. Pt not having any pain. Pt states she will continue warm bath the rest of the week to be make sure it clears.

## 2017-01-14 ENCOUNTER — Encounter: Payer: Medicaid Other | Admitting: Obstetrics & Gynecology

## 2017-01-14 ENCOUNTER — Ambulatory Visit (INDEPENDENT_AMBULATORY_CARE_PROVIDER_SITE_OTHER): Payer: Medicaid Other | Admitting: Obstetrics & Gynecology

## 2017-01-14 ENCOUNTER — Other Ambulatory Visit: Payer: Medicaid Other

## 2017-01-14 VITALS — BP 104/69 | HR 83 | Wt 216.0 lb

## 2017-01-14 DIAGNOSIS — Z3480 Encounter for supervision of other normal pregnancy, unspecified trimester: Secondary | ICD-10-CM

## 2017-01-14 DIAGNOSIS — Z3482 Encounter for supervision of other normal pregnancy, second trimester: Secondary | ICD-10-CM

## 2017-01-14 DIAGNOSIS — Z23 Encounter for immunization: Secondary | ICD-10-CM

## 2017-01-14 DIAGNOSIS — Z3490 Encounter for supervision of normal pregnancy, unspecified, unspecified trimester: Secondary | ICD-10-CM

## 2017-01-14 NOTE — Patient Instructions (Signed)

## 2017-01-14 NOTE — Progress Notes (Signed)
   PRENATAL VISIT NOTE  Subjective:  [name] is a 31 y.o. [id number] at 10272w6d being seen today for ongoing prenatal care.  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  Needs colpo pp.; Anxiety state, unspecified; Previous cesarean section complicating pregnancy, antepartum condition or complication; and Supervision of other normal pregnancy, antepartum on her problem list.  Patient reports no complaints.  Contractions: Not present. Vag. Bleeding: None.  Movement: Present. Denies leaking of fluid.   The following portions of the patient's history were reviewed and updated as appropriate: allergies, current medications, past family history, past medical history, past social history, past surgical history and problem list. Problem list updated.  Objective:   Vitals:   01/14/17 0852  BP: 104/69  Pulse: 83  Weight: 216 lb (98 kg)    Fetal Status:   Fundal Height: 28 cm Movement: Present     General:  Alert, oriented and cooperative. Patient is in no acute distress.  Skin: Skin is warm and dry. No rash noted.   Cardiovascular: Normal heart rate noted  Respiratory: Normal respiratory effort, no problems with respiration noted  Abdomen: Soft, gravid, appropriate for gestational age. Pain/Pressure: Present     Pelvic:  Cervical exam deferred        Extremities: Normal range of motion.  Edema: None  Mental Status: Normal mood and affect. Normal behavior. Normal judgment and thought content.   Assessment and Plan:  Pregnancy: [id number] at 6472w6d  1. Prenatal care, antepartum  - Glucose Tolerance, 2 Hours w/1 Hour - CBC - HIV antibody (with reflex)  2. Supervision of other normal pregnancy, antepartum Repeat cesarean and BTL at 39 weeks  Preterm labor symptoms and general obstetric precautions including but not limited to vaginal bleeding, contractions, leaking of fluid and fetal movement were reviewed  in detail with the patient. Please refer to After Visit Summary for other counseling recommendations.  Return in about 2 weeks (around 01/28/2017).   Adam PhenixJames G Arnold, MD

## 2017-01-15 LAB — CBC
Hematocrit: 31.7 % — ABNORMAL LOW (ref 34.0–46.6)
Hemoglobin: 10 g/dL — ABNORMAL LOW (ref 11.1–15.9)
MCH: 28.2 pg (ref 26.6–33.0)
MCHC: 31.5 g/dL (ref 31.5–35.7)
MCV: 90 fL (ref 79–97)
Platelets: 327 10*3/uL (ref 150–379)
RBC: 3.54 x10E6/uL — ABNORMAL LOW (ref 3.77–5.28)
RDW: 15 % (ref 12.3–15.4)
WBC: 11.8 10*3/uL — ABNORMAL HIGH (ref 3.4–10.8)

## 2017-01-15 LAB — GLUCOSE TOLERANCE, 2 HOURS W/ 1HR
Glucose, 1 hour: 110 mg/dL (ref 65–179)
Glucose, 2 hour: 104 mg/dL (ref 65–152)
Glucose, Fasting: 80 mg/dL (ref 65–91)

## 2017-01-15 LAB — HIV ANTIBODY (ROUTINE TESTING W REFLEX): HIV Screen 4th Generation wRfx: NONREACTIVE

## 2017-01-29 ENCOUNTER — Inpatient Hospital Stay (HOSPITAL_COMMUNITY)
Admission: AD | Admit: 2017-01-29 | Discharge: 2017-01-29 | Disposition: A | Discharge status: Home / Self Care | Payer: Medicaid Other | Source: Ambulatory Visit | Attending: Obstetrics and Gynecology | Admitting: Obstetrics and Gynecology

## 2017-01-29 ENCOUNTER — Encounter (HOSPITAL_COMMUNITY): Payer: Self-pay | Admitting: *Deleted

## 2017-01-29 ENCOUNTER — Ambulatory Visit (INDEPENDENT_AMBULATORY_CARE_PROVIDER_SITE_OTHER): Payer: Medicaid Other | Admitting: Obstetrics and Gynecology

## 2017-01-29 ENCOUNTER — Inpatient Hospital Stay (HOSPITAL_COMMUNITY): Payer: Medicaid Other

## 2017-01-29 VITALS — BP 106/65 | HR 81 | Wt 217.0 lb

## 2017-01-29 DIAGNOSIS — O26893 Other specified pregnancy related conditions, third trimester: Secondary | ICD-10-CM | POA: Diagnosis not present

## 2017-01-29 DIAGNOSIS — O34219 Maternal care for unspecified type scar from previous cesarean delivery: Secondary | ICD-10-CM

## 2017-01-29 DIAGNOSIS — Z331 Pregnant state, incidental: Secondary | ICD-10-CM

## 2017-01-29 DIAGNOSIS — Z3483 Encounter for supervision of other normal pregnancy, third trimester: Secondary | ICD-10-CM

## 2017-01-29 DIAGNOSIS — R109 Unspecified abdominal pain: Secondary | ICD-10-CM | POA: Diagnosis not present

## 2017-01-29 DIAGNOSIS — R1084 Generalized abdominal pain: Secondary | ICD-10-CM | POA: Diagnosis not present

## 2017-01-29 DIAGNOSIS — O34211 Maternal care for low transverse scar from previous cesarean delivery: Secondary | ICD-10-CM | POA: Diagnosis not present

## 2017-01-29 DIAGNOSIS — Z3480 Encounter for supervision of other normal pregnancy, unspecified trimester: Secondary | ICD-10-CM

## 2017-01-29 DIAGNOSIS — Z3A30 30 weeks gestation of pregnancy: Secondary | ICD-10-CM | POA: Diagnosis not present

## 2017-01-29 DIAGNOSIS — Y92414 Local residential or business street as the place of occurrence of the external cause: Secondary | ICD-10-CM | POA: Insufficient documentation

## 2017-01-29 DIAGNOSIS — O26899 Other specified pregnancy related conditions, unspecified trimester: Secondary | ICD-10-CM

## 2017-01-29 NOTE — Progress Notes (Signed)
Subjective:  Leeanne RioJamil D Digioia is a 31 y.o. [id number] at 157w0d being seen today for ongoing prenatal care.  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  Needs colpo pp.; Anxiety state, unspecified; Previous cesarean section complicating pregnancy, antepartum condition or complication; and Supervision of other normal pregnancy, antepartum on her problem list.  Patient reports no complaints.  Contractions: Irregular. Vag. Bleeding: None.  Movement: Present. Denies leaking of fluid.   The following portions of the patient's history were reviewed and updated as appropriate: allergies, current medications, past family history, past medical history, past social history, past surgical history and problem list. Problem list updated.  Objective:   Vitals:   01/29/17 0919  BP: 106/65  Pulse: 81  Weight: 217 lb (98.4 kg)    Fetal Status: Fetal Heart Rate (bpm): 131   Movement: Present     General:  Alert, oriented and cooperative. Patient is in no acute distress.  Skin: Skin is warm and dry. No rash noted.   Cardiovascular: Normal heart rate noted  Respiratory: Normal respiratory effort, no problems with respiration noted  Abdomen: Soft, gravid, appropriate for gestational age. Pain/Pressure: Absent     Pelvic:  Cervical exam deferred        Extremities: Normal range of motion.  Edema: Trace  Mental Status: Normal mood and affect. Normal behavior. Normal judgment and thought content.   Urinalysis:      Assessment and Plan:  Pregnancy: [id number] at 587w0d  1. Supervision of other normal pregnancy, antepartum No problems  2. Previous cesarean section complicating pregnancy, antepartum condition or complication Elective repeat C/S and BTL at 39 weeks  Preterm labor symptoms and general obstetric precautions including but not limited to vaginal bleeding, contractions, leaking of fluid and fetal movement were reviewed in  detail with the patient. Please refer to After Visit Summary for other counseling recommendations.  Return in about 2 weeks (around 02/12/2017) for OB visit.   Hermina StaggersMichael L Kishia Shackett, MD
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
## 2017-01-29 NOTE — MAU Note (Signed)
Urine in lab 
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
## 2017-01-29 NOTE — Discharge Instructions (Signed)
Third Trimester of Pregnancy The third trimester is from week 29 through week 40 (months 7 through 9). The third trimester is a time when the unborn baby (fetus) is growing rapidly. At the end of the ninth month, the fetus is about 20 inches in length and weighs 6-10 pounds. Body changes during your third trimester Your body goes through many changes during pregnancy. The changes vary from woman to woman. During the third trimester:  Your weight will continue to increase. You can expect to gain 25-35 pounds (11-16 kg) by the end of the pregnancy.  You may begin to get stretch marks on your hips, abdomen, and breasts.  You may urinate more often because the fetus is moving lower into your pelvis and pressing on your bladder.  You may develop or continue to have heartburn. This is caused by increased hormones that slow down muscles in the digestive tract.  You may develop or continue to have constipation because increased hormones slow digestion and cause the muscles that push waste through your intestines to relax.  You may develop hemorrhoids. These are swollen veins (varicose veins) in the rectum that can itch or be painful.  You may develop swollen, bulging veins (varicose veins) in your legs.  You may have increased body aches in the pelvis, back, or thighs. This is due to weight gain and increased hormones that are relaxing your joints.  You may have changes in your hair. These can include thickening of your hair, rapid growth, and changes in texture. Some women also have hair loss during or after pregnancy, or hair that feels dry or thin. Your hair will most likely return to normal after your baby is born.  Your breasts will continue to grow and they will continue to become tender. A yellow fluid (colostrum) may leak from your breasts. This is the first milk you are producing for your baby.  Your belly button may stick out.  You may notice more swelling in your hands, face, or  ankles.  You may have increased tingling or numbness in your hands, arms, and legs. The skin on your belly may also feel numb.  You may feel short of breath because of your expanding uterus.  You may have more problems sleeping. This can be caused by the size of your belly, increased need to urinate, and an increase in your body's metabolism.  You may notice the fetus "dropping," or moving lower in your abdomen.  You may have increased vaginal discharge.  Your cervix becomes thin and soft (effaced) near your due date. What to expect at prenatal visits You will have prenatal exams every 2 weeks until week 36. Then you will have weekly prenatal exams. During a routine prenatal visit:  You will be weighed to make sure you and the fetus are growing normally.  Your blood pressure will be taken.  Your abdomen will be measured to track your baby's growth.  The fetal heartbeat will be listened to.  Any test results from the previous visit will be discussed.  You may have a cervical check near your due date to see if you have effaced. At around 36 weeks, your health care provider will check your cervix. At the same time, your health care provider will also perform a test on the secretions of the vaginal tissue. This test is to determine if a type of bacteria, Group B streptococcus, is present. Your health care provider will explain this further. Your health care provider may ask you:    What your birth plan is.  How you are feeling.  If you are feeling the baby move.  If you have had any abnormal symptoms, such as leaking fluid, bleeding, severe headaches, or abdominal cramping.  If you are using any tobacco products, including cigarettes, chewing tobacco, and electronic cigarettes.  If you have any questions. Other tests or screenings that may be performed during your third trimester include:  Blood tests that check for low iron levels (anemia).  Fetal testing to check the health,  activity level, and growth of the fetus. Testing is done if you have certain medical conditions or if there are problems during the pregnancy.  Nonstress test (NST). This test checks the health of your baby to make sure there are no signs of problems, such as the baby not getting enough oxygen. During this test, a belt is placed around your belly. The baby is made to move, and its heart rate is monitored during movement. What is false labor? False labor is a condition in which you feel small, irregular tightenings of the muscles in the womb (contractions) that eventually go away. These are called Braxton Hicks contractions. Contractions may last for hours, days, or even weeks before true labor sets in. If contractions come at regular intervals, become more frequent, increase in intensity, or become painful, you should see your health care provider. What are the signs of labor?  Abdominal cramps.  Regular contractions that start at 10 minutes apart and become stronger and more frequent with time.  Contractions that start on the top of the uterus and spread down to the lower abdomen and back.  Increased pelvic pressure and dull back pain.  A watery or bloody mucus discharge that comes from the vagina.  Leaking of amniotic fluid. This is also known as your "water breaking." It could be a slow trickle or a gush. Let your doctor know if it has a color or strange odor. If you have any of these signs, call your health care provider right away, even if it is before your due date. Follow these instructions at home: Eating and drinking  Continue to eat regular, healthy meals.  Do not eat:  Raw meat or meat spreads.  Unpasteurized milk or cheese.  Unpasteurized juice.  Store-made salad.  Refrigerated smoked seafood.  Hot dogs or deli meat, unless they are piping hot.  More than 6 ounces of albacore tuna a week.  Shark, swordfish, king mackerel, or tile fish.  Store-made salads.  Raw  sprouts, such as mung bean or alfalfa sprouts.  Take prenatal vitamins as told by your health care provider.  Take 1000 mg of calcium daily as told by your health care provider.  If you develop constipation:  Take over-the-counter or prescription medicines.  Drink enough fluid to keep your urine clear or pale yellow.  Eat foods that are high in fiber, such as fresh fruits and vegetables, whole grains, and beans.  Limit foods that are high in fat and processed sugars, such as fried and sweet foods. Activity  Exercise only as directed by your health care provider. Healthy pregnant women should aim for 2 hours and 30 minutes of moderate exercise per week. If you experience any pain or discomfort while exercising, stop.  Avoid heavy lifting.  Do not exercise in extreme heat or humidity, or at high altitudes.  Wear low-heel, comfortable shoes.  Practice good posture.  Do not travel far distances unless it is absolutely necessary and only with the approval   of your health care provider.  Wear your seat belt at all times while in a car, on a bus, or on a plane.  Take frequent breaks and rest with your legs elevated if you have leg cramps or low back pain.  Do not use hot tubs, steam rooms, or saunas.  You may continue to have sex unless your health care provider tells you otherwise. Lifestyle  Do not use any products that contain nicotine or tobacco, such as cigarettes and e-cigarettes. If you need help quitting, ask your health care provider.  Do not drink alcohol.  Do not use any medicinal herbs or unprescribed drugs. These chemicals affect the formation and growth of the baby.  If you develop varicose veins:  Wear support pantyhose or compression stockings as told by your healthcare provider.  Elevate your feet for 15 minutes, 3-4 times a day.  Wear a supportive maternity bra to help with breast tenderness. General instructions  Take over-the-counter and prescription  medicines only as told by your health care provider. There are medicines that are either safe or unsafe to take during pregnancy.  Take warm sitz baths to soothe any pain or discomfort caused by hemorrhoids. Use hemorrhoid cream or witch hazel if your health care provider approves.  Avoid cat litter boxes and soil used by cats. These carry germs that can cause birth defects in the baby. If you have a cat, ask someone to clean the litter box for you.  To prepare for the arrival of your baby:  Take prenatal classes to understand, practice, and ask questions about the labor and delivery.  Make a trial run to the hospital.  Visit the hospital and tour the maternity area.  Arrange for maternity or paternity leave through employers.  Arrange for family and friends to take care of pets while you are in the hospital.  Purchase a rear-facing car seat and make sure you know how to install it in your car.  Pack your hospital bag.  Prepare the baby's nursery. Make sure to remove all pillows and stuffed animals from the baby's crib to prevent suffocation.  Visit your dentist if you have not gone during your pregnancy. Use a soft toothbrush to brush your teeth and be gentle when you floss.  Keep all prenatal follow-up visits as told by your health care provider. This is important. Contact a health care provider if:  You are unsure if you are in labor or if your water has broken.  You become dizzy.  You have mild pelvic cramps, pelvic pressure, or nagging pain in your abdominal area.  You have lower back pain.  You have persistent nausea, vomiting, or diarrhea.  You have an unusual or bad smelling vaginal discharge.  You have pain when you urinate. Get help right away if:  You have a fever.  You are leaking fluid from your vagina.  You have spotting or bleeding from your vagina.  You have severe abdominal pain or cramping.  You have rapid weight loss or weight gain.  You have  shortness of breath with chest pain.  You notice sudden or extreme swelling of your face, hands, ankles, feet, or legs.  Your baby makes fewer than 10 movements in 2 hours.  You have severe headaches that do not go away with medicine.  You have vision changes. Summary  The third trimester is from week 29 through week 40, months 7 through 9. The third trimester is a time when the unborn baby (fetus)   is growing rapidly.  During the third trimester, your discomfort may increase as you and your baby continue to gain weight. You may have abdominal, leg, and back pain, sleeping problems, and an increased need to urinate.  During the third trimester your breasts will keep growing and they will continue to become tender. A yellow fluid (colostrum) may leak from your breasts. This is the first milk you are producing for your baby.  False labor is a condition in which you feel small, irregular tightenings of the muscles in the womb (contractions) that eventually go away. These are called Braxton Hicks contractions. Contractions may last for hours, days, or even weeks before true labor sets in.  Signs of labor can include: abdominal cramps; regular contractions that start at 10 minutes apart and become stronger and more frequent with time; watery or bloody mucus discharge that comes from the vagina; increased pelvic pressure and dull back pain; and leaking of amniotic fluid. This information is not intended to replace advice given to you by your health care provider. Make sure you discuss any questions you have with your health care provider. Document Released: 12/10/2001 Document Revised: 05/23/2016 Document Reviewed: 02/16/2013 [employer] Interactive Patient Education  2017 [employer]  

## 2017-02-03 ENCOUNTER — Encounter (HOSPITAL_COMMUNITY): Payer: Self-pay

## 2017-02-12 ENCOUNTER — Ambulatory Visit (INDEPENDENT_AMBULATORY_CARE_PROVIDER_SITE_OTHER): Payer: Medicaid Other | Admitting: Obstetrics and Gynecology

## 2017-02-12 DIAGNOSIS — Z3480 Encounter for supervision of other normal pregnancy, unspecified trimester: Secondary | ICD-10-CM

## 2017-02-12 DIAGNOSIS — Z3483 Encounter for supervision of other normal pregnancy, third trimester: Secondary | ICD-10-CM

## 2017-02-12 NOTE — Progress Notes (Signed)
Subjective:  [name] is a 31 y.o. [id number] at 4343w0d being seen today for ongoing prenatal care.  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  Needs colpo pp.; Anxiety state, unspecified; Previous cesarean section complicating pregnancy, antepartum condition or complication; and Supervision of other normal pregnancy, antepartum on her problem list.  Patient reports no complaints.  Contractions: Irregular. Vag. Bleeding: None.  Movement: Present. Denies leaking of fluid.   The following portions of the patient's history were reviewed and updated as appropriate: allergies, current medications, past family history, past medical history, past social history, past surgical history and problem list. Problem list updated.  Objective:   Vitals:   02/12/17 1315  BP: 120/69  Pulse: 84  Weight: 218 lb (98.9 kg)    Fetal Status: Fetal Heart Rate (bpm): 136   Movement: Present     General:  Alert, oriented and cooperative. Patient is in no acute distress.  Skin: Skin is warm and dry. No rash noted.   Cardiovascular: Normal heart rate noted  Respiratory: Normal respiratory effort, no problems with respiration noted  Abdomen: Soft, gravid, appropriate for gestational age. Pain/Pressure: Present     Pelvic:  Cervical exam deferred        Extremities: Normal range of motion.  Edema: Trace  Mental Status: Normal mood and affect. Normal behavior. Normal judgment and thought content.   Urinalysis:      Assessment and Plan:  Pregnancy: [id number] at 1343w0d  1. Supervision of other normal pregnancy, antepartum Stable RLTCS/BTL scheduled  Preterm labor symptoms and general obstetric precautions including but not limited to vaginal bleeding, contractions, leaking of fluid and fetal movement were reviewed in detail with the patient. Please refer to After Visit Summary for other counseling recommendations.  Return in  about 2 weeks (around 02/26/2017) for OB visit.   Hermina StaggersMichael L Dakhari Zuver, MD

## 2017-02-12 NOTE — Patient Instructions (Signed)
Third Trimester of Pregnancy The third trimester is from week 29 through week 40 (months 7 through 9). The third trimester is a time when the unborn baby (fetus) is growing rapidly. At the end of the ninth month, the fetus is about 20 inches in length and weighs 6-10 pounds. Body changes during your third trimester Your body goes through many changes during pregnancy. The changes vary from woman to woman. During the third trimester:  Your weight will continue to increase. You can expect to gain 25-35 pounds (11-16 kg) by the end of the pregnancy.  You may begin to get stretch marks on your hips, abdomen, and breasts.  You may urinate more often because the fetus is moving lower into your pelvis and pressing on your bladder.  You may develop or continue to have heartburn. This is caused by increased hormones that slow down muscles in the digestive tract.  You may develop or continue to have constipation because increased hormones slow digestion and cause the muscles that push waste through your intestines to relax.  You may develop hemorrhoids. These are swollen veins (varicose veins) in the rectum that can itch or be painful.  You may develop swollen, bulging veins (varicose veins) in your legs.  You may have increased body aches in the pelvis, back, or thighs. This is due to weight gain and increased hormones that are relaxing your joints.  You may have changes in your hair. These can include thickening of your hair, rapid growth, and changes in texture. Some women also have hair loss during or after pregnancy, or hair that feels dry or thin. Your hair will most likely return to normal after your baby is born.  Your breasts will continue to grow and they will continue to become tender. A yellow fluid (colostrum) may leak from your breasts. This is the first milk you are producing for your baby.  Your belly button may stick out.  You may notice more swelling in your hands, face, or  ankles.  You may have increased tingling or numbness in your hands, arms, and legs. The skin on your belly may also feel numb.  You may feel short of breath because of your expanding uterus.  You may have more problems sleeping. This can be caused by the size of your belly, increased need to urinate, and an increase in your body's metabolism.  You may notice the fetus "dropping," or moving lower in your abdomen.  You may have increased vaginal discharge.  Your cervix becomes thin and soft (effaced) near your due date. What to expect at prenatal visits You will have prenatal exams every 2 weeks until week 36. Then you will have weekly prenatal exams. During a routine prenatal visit:  You will be weighed to make sure you and the fetus are growing normally.  Your blood pressure will be taken.  Your abdomen will be measured to track your baby's growth.  The fetal heartbeat will be listened to.  Any test results from the previous visit will be discussed.  You may have a cervical check near your due date to see if you have effaced. At around 36 weeks, your health care provider will check your cervix. At the same time, your health care provider will also perform a test on the secretions of the vaginal tissue. This test is to determine if a type of bacteria, Group B streptococcus, is present. Your health care provider will explain this further. Your health care provider may ask you:    What your birth plan is.  How you are feeling.  If you are feeling the baby move.  If you have had any abnormal symptoms, such as leaking fluid, bleeding, severe headaches, or abdominal cramping.  If you are using any tobacco products, including cigarettes, chewing tobacco, and electronic cigarettes.  If you have any questions. Other tests or screenings that may be performed during your third trimester include:  Blood tests that check for low iron levels (anemia).  Fetal testing to check the health,  activity level, and growth of the fetus. Testing is done if you have certain medical conditions or if there are problems during the pregnancy.  Nonstress test (NST). This test checks the health of your baby to make sure there are no signs of problems, such as the baby not getting enough oxygen. During this test, a belt is placed around your belly. The baby is made to move, and its heart rate is monitored during movement. What is false labor? False labor is a condition in which you feel small, irregular tightenings of the muscles in the womb (contractions) that eventually go away. These are called Braxton Hicks contractions. Contractions may last for hours, days, or even weeks before true labor sets in. If contractions come at regular intervals, become more frequent, increase in intensity, or become painful, you should see your health care provider. What are the signs of labor?  Abdominal cramps.  Regular contractions that start at 10 minutes apart and become stronger and more frequent with time.  Contractions that start on the top of the uterus and spread down to the lower abdomen and back.  Increased pelvic pressure and dull back pain.  A watery or bloody mucus discharge that comes from the vagina.  Leaking of amniotic fluid. This is also known as your "water breaking." It could be a slow trickle or a gush. Let your doctor know if it has a color or strange odor. If you have any of these signs, call your health care provider right away, even if it is before your due date. Follow these instructions at home: Eating and drinking  Continue to eat regular, healthy meals.  Do not eat:  Raw meat or meat spreads.  Unpasteurized milk or cheese.  Unpasteurized juice.  Store-made salad.  Refrigerated smoked seafood.  Hot dogs or deli meat, unless they are piping hot.  More than 6 ounces of albacore tuna a week.  Shark, swordfish, king mackerel, or tile fish.  Store-made salads.  Raw  sprouts, such as mung bean or alfalfa sprouts.  Take prenatal vitamins as told by your health care provider.  Take 1000 mg of calcium daily as told by your health care provider.  If you develop constipation:  Take over-the-counter or prescription medicines.  Drink enough fluid to keep your urine clear or pale yellow.  Eat foods that are high in fiber, such as fresh fruits and vegetables, whole grains, and beans.  Limit foods that are high in fat and processed sugars, such as fried and sweet foods. Activity  Exercise only as directed by your health care provider. Healthy pregnant women should aim for 2 hours and 30 minutes of moderate exercise per week. If you experience any pain or discomfort while exercising, stop.  Avoid heavy lifting.  Do not exercise in extreme heat or humidity, or at high altitudes.  Wear low-heel, comfortable shoes.  Practice good posture.  Do not travel far distances unless it is absolutely necessary and only with the approval   of your health care provider.  Wear your seat belt at all times while in a car, on a bus, or on a plane.  Take frequent breaks and rest with your legs elevated if you have leg cramps or low back pain.  Do not use hot tubs, steam rooms, or saunas.  You may continue to have sex unless your health care provider tells you otherwise. Lifestyle  Do not use any products that contain nicotine or tobacco, such as cigarettes and e-cigarettes. If you need help quitting, ask your health care provider.  Do not drink alcohol.  Do not use any medicinal herbs or unprescribed drugs. These chemicals affect the formation and growth of the baby.  If you develop varicose veins:  Wear support pantyhose or compression stockings as told by your healthcare provider.  Elevate your feet for 15 minutes, 3-4 times a day.  Wear a supportive maternity bra to help with breast tenderness. General instructions  Take over-the-counter and prescription  medicines only as told by your health care provider. There are medicines that are either safe or unsafe to take during pregnancy.  Take warm sitz baths to soothe any pain or discomfort caused by hemorrhoids. Use hemorrhoid cream or witch hazel if your health care provider approves.  Avoid cat litter boxes and soil used by cats. These carry germs that can cause birth defects in the baby. If you have a cat, ask someone to clean the litter box for you.  To prepare for the arrival of your baby:  Take prenatal classes to understand, practice, and ask questions about the labor and delivery.  Make a trial run to the hospital.  Visit the hospital and tour the maternity area.  Arrange for maternity or paternity leave through employers.  Arrange for family and friends to take care of pets while you are in the hospital.  Purchase a rear-facing car seat and make sure you know how to install it in your car.  Pack your hospital bag.  Prepare the baby's nursery. Make sure to remove all pillows and stuffed animals from the baby's crib to prevent suffocation.  Visit your dentist if you have not gone during your pregnancy. Use a soft toothbrush to brush your teeth and be gentle when you floss.  Keep all prenatal follow-up visits as told by your health care provider. This is important. Contact a health care provider if:  You are unsure if you are in labor or if your water has broken.  You become dizzy.  You have mild pelvic cramps, pelvic pressure, or nagging pain in your abdominal area.  You have lower back pain.  You have persistent nausea, vomiting, or diarrhea.  You have an unusual or bad smelling vaginal discharge.  You have pain when you urinate. Get help right away if:  You have a fever.  You are leaking fluid from your vagina.  You have spotting or bleeding from your vagina.  You have severe abdominal pain or cramping.  You have rapid weight loss or weight gain.  You have  shortness of breath with chest pain.  You notice sudden or extreme swelling of your face, hands, ankles, feet, or legs.  Your baby makes fewer than 10 movements in 2 hours.  You have severe headaches that do not go away with medicine.  You have vision changes. Summary  The third trimester is from week 29 through week 40, months 7 through 9. The third trimester is a time when the unborn baby (fetus)   is growing rapidly.  During the third trimester, your discomfort may increase as you and your baby continue to gain weight. You may have abdominal, leg, and back pain, sleeping problems, and an increased need to urinate.  During the third trimester your breasts will keep growing and they will continue to become tender. A yellow fluid (colostrum) may leak from your breasts. This is the first milk you are producing for your baby.  False labor is a condition in which you feel small, irregular tightenings of the muscles in the womb (contractions) that eventually go away. These are called Braxton Hicks contractions. Contractions may last for hours, days, or even weeks before true labor sets in.  Signs of labor can include: abdominal cramps; regular contractions that start at 10 minutes apart and become stronger and more frequent with time; watery or bloody mucus discharge that comes from the vagina; increased pelvic pressure and dull back pain; and leaking of amniotic fluid. This information is not intended to replace advice given to you by your health care provider. Make sure you discuss any questions you have with your health care provider. Document Released: 12/10/2001 Document Revised: 05/23/2016 Document Reviewed: 02/16/2013 Elsevier Interactive Patient Education  2017 [employer]  

## 2017-02-19 ENCOUNTER — Encounter (HOSPITAL_COMMUNITY): Payer: Self-pay | Admitting: *Deleted

## 2017-02-19 ENCOUNTER — Inpatient Hospital Stay (HOSPITAL_COMMUNITY)
Admission: AD | Admit: 2017-02-19 | Discharge: 2017-02-19 | Disposition: A | Discharge status: Home / Self Care | Payer: Medicaid Other | Source: Ambulatory Visit | Attending: Family Medicine | Admitting: Family Medicine

## 2017-02-19 DIAGNOSIS — O2243 Hemorrhoids in pregnancy, third trimester: Secondary | ICD-10-CM | POA: Diagnosis not present

## 2017-02-19 DIAGNOSIS — Z3A33 33 weeks gestation of pregnancy: Secondary | ICD-10-CM | POA: Insufficient documentation

## 2017-02-19 MED ORDER — HYDROCORTISONE ACE-PRAMOXINE 1-1 % RE FOAM: 1.0000 | Freq: Two times a day (BID) | RECTAL | 0 refills | Status: DC

## 2017-02-19 NOTE — MAU Provider Note (Signed)
History     CSN: [id number]  Arrival date and time: 02/19/17 [id number]   First Provider Initiated Contact with Patient 02/19/17 0825      Chief Complaint  Patient presents with  . Hemorrhoids   HPI   [name] is a 31 y.o. female [id number] @ 8082w0d here in MAU with concerns about her hemorrhoids. When she used the bathroom this morning she noticed bright red blood from her rectum. It was a small amount. She has tried over the counter medication for the hemorrhoids and does not feel it is helping.  No pain at this time.  + fetal movement Denies leaking of fluid.   OB History    Gravida Para Term Preterm AB Living   4 2 2   1 2    SAB TAB Ectopic Multiple Live Births   1       2      Past Medical History:  Diagnosis Date  . CIN III (cervical intraepithelial neoplasia III)   . History of kidney stones   . Wears glasses     Past Surgical History:  Procedure Laterality Date  . CESAREAN SECTION  12-23-2002  . CESAREAN SECTION N/A 04/07/2014   Procedure: CESAREAN SECTION;  Surgeon: Michael LitterNaima A Dillard, MD;  Location: [facility];  Service: Obstetrics;  Laterality: N/A;  . COLPOSCOPY    . LEEP N/A 09/14/2014   Procedure: LOOP ELECTROSURGICAL EXCISION PROCEDURE (LEEP);  Surgeon: Bernita BuffyPaola A. Duard BradyGehrig, MD;  [facility];  Service: Gynecology;  Laterality: N/A;  . WISDOM TOOTH EXTRACTION  2012    Family History  Problem Relation Age of Onset  . Anesthesia problems Neg Hx   . Alcohol abuse Neg Hx     Social History  Substance Use Topics  . Smoking status: Never Smoker  . Smokeless tobacco: Never Used  . Alcohol use No     Comment: rarely    Allergies: No Known Allergies  Prescriptions Prior to Admission  Medication Sig Dispense Refill Last Dose  . acetaminophen (TYLENOL) 500 MG tablet Take 500 mg by mouth every 6 (six) hours as needed for mild pain.    Taking  . Prenatal Vit-Fe Fumarate-FA (PRENATAL MULTIVITAMIN) TABS tablet Take 1 tablet by mouth daily  at 12 noon.   Taking  . triamcinolone ointment (KENALOG) 0.5 % Apply 1 application topically 2 (two) times daily. (Patient not taking: Reported on 02/12/2017) 90 g PRN Not Taking   No results found for this or any previous visit (from the past 48 hour(s)).  Review of Systems  Gastrointestinal: Negative for abdominal pain.  Genitourinary: Negative for vaginal bleeding.   Physical Exam   Blood pressure 102/56, pulse 84, temperature 98.6 F (37 C), temperature source Oral, resp. rate 18, height 5\' 5"  (1.651 m), weight 220 lb 8 oz (100 kg), last menstrual period 07/08/2016.  Physical Exam  Constitutional: She is oriented to person, place, and time. She appears well-developed and well-nourished. No distress.  HENT:  Head: Normocephalic.  Eyes: Pupils are equal, round, and reactive to light.  Genitourinary: Rectal exam shows external hemorrhoid.  Genitourinary Comments: Non thrombosed external hemorrhoids noted   Musculoskeletal: Normal range of motion.  Neurological: She is alert and oriented to person, place, and time.  Skin: Skin is warm. She is not diaphoretic.  Psychiatric: Her behavior is normal.  Reactive fetal tracing, + accels, no decels, no contractions  MAU Course  Procedures  None  MDM   Assessment and Plan  A:  1. Hemorrhoids in pregnancy in third trimester     P:  Discharge home in stable condition Rx: proctofoam  Return to MAU for OB emergencies  Follow up with OB  Duane Lope, NP 02/19/2017 2:44 PM

## 2017-02-19 NOTE — MAU Note (Signed)
C/o hemorrhoid since this past weekend; no pain at present but when she does have pain it is about a 7; had some bleeding from hemorrhoid this AM; denies constipation but has not had a BM today;

## 2017-02-19 NOTE — MAU Note (Signed)
PT  HAS  HX  OF  HEMM-   THEN THIS AM  - TO B-ROOM - NO BM    BUT  WHEN  SHE  WIPED-  SAW BLOOD  FROM  RECTUM - FEELS  HEMM- BUT  DOES NOT  HURT.     PNC-  WITH  FAMINA- -   HEMM WERE NOT  OUT.

## 2017-02-19 NOTE — Discharge Instructions (Signed)
Hemorrhoids °Introduction °Hemorrhoids are swollen veins in and around the rectum or anus. Hemorrhoids can cause pain, itching, or bleeding. Most of the time, they do not cause serious problems. They usually get better with diet changes, lifestyle changes, and other home treatments. °Follow these instructions at home: °Eating and drinking °· Eat foods that have fiber, such as whole grains, beans, nuts, fruits, and vegetables. Ask your doctor about taking products that have added fiber (fiber supplements). °· Drink enough fluid to keep your pee (urine) clear or pale yellow. °For Pain and Swelling °· Take a warm-water bath (sitz bath) for 20 minutes to ease pain. Do this 3-4 times a day. °· If directed, put ice on the painful area. It may be helpful to use ice between your warm baths. °¨ Put ice in a plastic bag. °¨ Place a towel between your skin and the bag. °¨ Leave the ice on for 20 minutes, 2-3 times a day. °General instructions °· Take over-the-counter and prescription medicines only as told by your doctor. °¨ Medicated creams and medicines that are inserted into the anus (suppositories) may be used or applied as told. °· Exercise often. °· Go to the bathroom when you have the urge to poop (to have a bowel movement). Do not wait. °· Avoid pushing too hard (straining) when you poop. °· Keep the butt area dry and clean. Use wet toilet paper or moist paper towels. °· Do not sit on the toilet for a long time. °Contact a doctor if: °· You have any of these: °¨ Pain and swelling that do not get better with treatment or medicine. °¨ Bleeding that will not stop. °¨ Trouble pooping or you cannot poop. °¨ Pain or swelling outside the area of the hemorrhoids. °This information is not intended to replace advice given to you by your health care provider. Make sure you discuss any questions you have with your health care provider. °Document Released: 09/24/2008 Document Revised: 05/23/2016 Document Reviewed: 08/30/2015 °©  2017 Elsevier ° °

## 2017-02-25 ENCOUNTER — Ambulatory Visit (INDEPENDENT_AMBULATORY_CARE_PROVIDER_SITE_OTHER): Payer: Medicaid Other | Admitting: Obstetrics and Gynecology

## 2017-02-25 VITALS — BP 122/60 | HR 83 | Wt 218.0 lb

## 2017-02-25 DIAGNOSIS — Z3009 Encounter for other general counseling and advice on contraception: Secondary | ICD-10-CM

## 2017-02-25 DIAGNOSIS — O34219 Maternal care for unspecified type scar from previous cesarean delivery: Secondary | ICD-10-CM

## 2017-02-25 DIAGNOSIS — Z309 Encounter for contraceptive management, unspecified: Secondary | ICD-10-CM | POA: Insufficient documentation

## 2017-02-25 DIAGNOSIS — Z3480 Encounter for supervision of other normal pregnancy, unspecified trimester: Secondary | ICD-10-CM

## 2017-02-25 DIAGNOSIS — Z3483 Encounter for supervision of other normal pregnancy, third trimester: Secondary | ICD-10-CM

## 2017-02-25 NOTE — Progress Notes (Signed)
Subjective:  [name] is a 31 y.o. [phone] at 679w6d being seen today for ongoing prenatal care.  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  Needs colpo pp.; Anxiety state, unspecified; Previous cesarean section complicating pregnancy, antepartum condition or complication; Supervision of other normal pregnancy, antepartum; and Unwanted fertility on her problem list.  Patient reports no complaints.  Contractions: Irritability. Vag. Bleeding: None.  Movement: Present. Denies leaking of fluid.   The following portions of the patient's history were reviewed and updated as appropriate: allergies, current medications, past family history, past medical history, past social history, past surgical history and problem list. Problem list updated.  Objective:   Vitals:   02/25/17 1331  BP: 122/60  Pulse: 83  Weight: 218 lb (98.9 kg)    Fetal Status: Fetal Heart Rate (bpm): 125   Movement: Present     General:  Alert, oriented and cooperative. Patient is in no acute distress.  Skin: Skin is warm and dry. No rash noted.   Cardiovascular: Normal heart rate noted  Respiratory: Normal respiratory effort, no problems with respiration noted  Abdomen: Soft, gravid, appropriate for gestational age. Pain/Pressure: Present     Pelvic:  Cervical exam deferred        Extremities: Normal range of motion.  Edema: Trace  Mental Status: Normal mood and affect. Normal behavior. Normal judgment and thought content.   Urinalysis: Urine Protein: Negative Urine Glucose: Negative  Assessment and Plan:  Pregnancy: [id number] at 2679w6d  1. Previous cesarean section complicating pregnancy, antepartum condition or complication RLTCS/BTL for April 4  2. Supervision of other normal pregnancy, antepartum stable  3. Unwanted fertility Papers signed  Preterm labor symptoms and general obstetric precautions including but not limited to  vaginal bleeding, contractions, leaking of fluid and fetal movement were reviewed in detail with the patient. Please refer to After Visit Summary for other counseling recommendations.  Return in about 2 weeks (around 03/11/2017) for OB visit.   Hermina StaggersMichael L Ashleigh Arya, MD

## 2017-02-25 NOTE — Patient Instructions (Signed)
Third Trimester of Pregnancy The third trimester is from week 28 through week 40 (months 7 through 9). The third trimester is a time when the unborn baby (fetus) is growing rapidly. At the end of the ninth month, the fetus is about 20 inches in length and weighs 6-10 pounds. Body changes during your third trimester Your body will continue to go through many changes during pregnancy. The changes vary from woman to woman. During the third trimester:  Your weight will continue to increase. You can expect to gain 25-35 pounds (11-16 kg) by the end of the pregnancy.  You may begin to get stretch marks on your hips, abdomen, and breasts.  You may urinate more often because the fetus is moving lower into your pelvis and pressing on your bladder.  You may develop or continue to have heartburn. This is caused by increased hormones that slow down muscles in the digestive tract.  You may develop or continue to have constipation because increased hormones slow digestion and cause the muscles that push waste through your intestines to relax.  You may develop hemorrhoids. These are swollen veins (varicose veins) in the rectum that can itch or be painful.  You may develop swollen, bulging veins (varicose veins) in your legs.  You may have increased body aches in the pelvis, back, or thighs. This is due to weight gain and increased hormones that are relaxing your joints.  You may have changes in your hair. These can include thickening of your hair, rapid growth, and changes in texture. Some women also have hair loss during or after pregnancy, or hair that feels dry or thin. Your hair will most likely return to normal after your baby is born.  Your breasts will continue to grow and they will continue to become tender. A yellow fluid (colostrum) may leak from your breasts. This is the first milk you are producing for your baby.  Your belly button may stick out.  You may notice more swelling in your hands,  face, or ankles.  You may have increased tingling or numbness in your hands, arms, and legs. The skin on your belly may also feel numb.  You may feel short of breath because of your expanding uterus.  You may have more problems sleeping. This can be caused by the size of your belly, increased need to urinate, and an increase in your body's metabolism.  You may notice the fetus "dropping," or moving lower in your abdomen (lightening).  You may have increased vaginal discharge.  You may notice your joints feel loose and you may have pain around your pelvic bone.  What to expect at prenatal visits You will have prenatal exams every 2 weeks until week 36. Then you will have weekly prenatal exams. During a routine prenatal visit:  You will be weighed to make sure you and the baby are growing normally.  Your blood pressure will be taken.  Your abdomen will be measured to track your baby's growth.  The fetal heartbeat will be listened to.  Any test results from the previous visit will be discussed.  You may have a cervical check near your due date to see if your cervix has softened or thinned (effaced).  You will be tested for Group B streptococcus. This happens between 35 and 37 weeks.  Your health care provider may ask you:  What your birth plan is.  How you are feeling.  If you are feeling the baby move.  If you have had   any abnormal symptoms, such as leaking fluid, bleeding, severe headaches, or abdominal cramping.  If you are using any tobacco products, including cigarettes, chewing tobacco, and electronic cigarettes.  If you have any questions.  Other tests or screenings that may be performed during your third trimester include:  Blood tests that check for low iron levels (anemia).  Fetal testing to check the health, activity level, and growth of the fetus. Testing is done if you have certain medical conditions or if there are problems during the  pregnancy.  Nonstress test (NST). This test checks the health of your baby to make sure there are no signs of problems, such as the baby not getting enough oxygen. During this test, a belt is placed around your belly. The baby is made to move, and its heart rate is monitored during movement.  What is false labor? False labor is a condition in which you feel small, irregular tightenings of the muscles in the womb (contractions) that usually go away with rest, changing position, or drinking water. These are called Braxton Hicks contractions. Contractions may last for hours, days, or even weeks before true labor sets in. If contractions come at regular intervals, become more frequent, increase in intensity, or become painful, you should see your health care provider. What are the signs of labor?  Abdominal cramps.  Regular contractions that start at 10 minutes apart and become stronger and more frequent with time.  Contractions that start on the top of the uterus and spread down to the lower abdomen and back.  Increased pelvic pressure and dull back pain.  A watery or bloody mucus discharge that comes from the vagina.  Leaking of amniotic fluid. This is also known as your "water breaking." It could be a slow trickle or a gush. Let your health care provider know if it has a color or strange odor. If you have any of these signs, call your health care provider right away, even if it is before your due date. Follow these instructions at home: Medicines  Follow your health care provider's instructions regarding medicine use. Specific medicines may be either safe or unsafe to take during pregnancy.  Take a prenatal vitamin that contains at least 600 micrograms (mcg) of folic acid.  If you develop constipation, try taking a stool softener if your health care provider approves. Eating and drinking  Eat a balanced diet that includes fresh fruits and vegetables, whole grains, good sources of protein  such as meat, eggs, or tofu, and low-fat dairy. Your health care provider will help you determine the amount of weight gain that is right for you.  Avoid raw meat and uncooked cheese. These carry germs that can cause birth defects in the baby.  If you have low calcium intake from food, talk to your health care provider about whether you should take a daily calcium supplement.  Eat four or five small meals rather than three large meals a day.  Limit foods that are high in fat and processed sugars, such as fried and sweet foods.  To prevent constipation: ? Drink enough fluid to keep your urine clear or pale yellow. ? Eat foods that are high in fiber, such as fresh fruits and vegetables, whole grains, and beans. Activity  Exercise only as directed by your health care provider. Most women can continue their usual exercise routine during pregnancy. Try to exercise for 30 minutes at least 5 days a week. Stop exercising if you experience uterine contractions.  Avoid heavy   lifting.  Do not exercise in extreme heat or humidity, or at high altitudes.  Wear low-heel, comfortable shoes.  Practice good posture.  You may continue to have sex unless your health care provider tells you otherwise. Relieving pain and discomfort  Take frequent breaks and rest with your legs elevated if you have leg cramps or low back pain.  Take warm sitz baths to soothe any pain or discomfort caused by hemorrhoids. Use hemorrhoid cream if your health care provider approves.  Wear a good support bra to prevent discomfort from breast tenderness.  If you develop varicose veins: ? Wear support pantyhose or compression stockings as told by your healthcare provider. ? Elevate your feet for 15 minutes, 3-4 times a day. Prenatal care  Write down your questions. Take them to your prenatal visits.  Keep all your prenatal visits as told by your health care provider. This is important. Safety  Wear your seat belt at  all times when driving.  Make a list of emergency phone numbers, including numbers for family, friends, the hospital, and police and fire departments. General instructions  Avoid cat litter boxes and soil used by cats. These carry germs that can cause birth defects in the baby. If you have a cat, ask someone to clean the litter box for you.  Do not travel far distances unless it is absolutely necessary and only with the approval of your health care provider.  Do not use hot tubs, steam rooms, or saunas.  Do not drink alcohol.  Do not use any products that contain nicotine or tobacco, such as cigarettes and e-cigarettes. If you need help quitting, ask your health care provider.  Do not use any medicinal herbs or unprescribed drugs. These chemicals affect the formation and growth of the baby.  Do not douche or use tampons or scented sanitary pads.  Do not cross your legs for long periods of time.  To prepare for the arrival of your baby: ? Take prenatal classes to understand, practice, and ask questions about labor and delivery. ? Make a trial run to the hospital. ? Visit the hospital and tour the maternity area. ? Arrange for maternity or paternity leave through employers. ? Arrange for family and friends to take care of pets while you are in the hospital. ? Purchase a rear-facing car seat and make sure you know how to install it in your car. ? Pack your hospital bag. ? Prepare the baby's nursery. Make sure to remove all pillows and stuffed animals from the baby's crib to prevent suffocation.  Visit your dentist if you have not gone during your pregnancy. Use a soft toothbrush to brush your teeth and be gentle when you floss. Contact a health care provider if:  You are unsure if you are in labor or if your water has broken.  You become dizzy.  You have mild pelvic cramps, pelvic pressure, or nagging pain in your abdominal area.  You have lower back pain.  You have persistent  nausea, vomiting, or diarrhea.  You have an unusual or bad smelling vaginal discharge.  You have pain when you urinate. Get help right away if:  Your water breaks before 37 weeks.  You have regular contractions less than 5 minutes apart before 37 weeks.  You have a fever.  You are leaking fluid from your vagina.  You have spotting or bleeding from your vagina.  You have severe abdominal pain or cramping.  You have rapid weight loss or weight gain.    You have shortness of breath with chest pain.  You notice sudden or extreme swelling of your face, hands, ankles, feet, or legs.  Your baby makes fewer than 10 movements in 2 hours.  You have severe headaches that do not go away when you take medicine.  You have vision changes. Summary  The third trimester is from week 28 through week 40, months 7 through 9. The third trimester is a time when the unborn baby (fetus) is growing rapidly.  During the third trimester, your discomfort may increase as you and your baby continue to gain weight. You may have abdominal, leg, and back pain, sleeping problems, and an increased need to urinate.  During the third trimester your breasts will keep growing and they will continue to become tender. A yellow fluid (colostrum) may leak from your breasts. This is the first milk you are producing for your baby.  False labor is a condition in which you feel small, irregular tightenings of the muscles in the womb (contractions) that eventually go away. These are called Braxton Hicks contractions. Contractions may last for hours, days, or even weeks before true labor sets in.  Signs of labor can include: abdominal cramps; regular contractions that start at 10 minutes apart and become stronger and more frequent with time; watery or bloody mucus discharge that comes from the vagina; increased pelvic pressure and dull back pain; and leaking of amniotic fluid. This information is not intended to replace advice  given to you by your health care provider. Make sure you discuss any questions you have with your health care provider. Document Released: 12/10/2001 Document Revised: 05/23/2016 Document Reviewed: 02/16/2013 Elsevier Interactive Patient Education  2017 [employer]  

## 2017-03-12 ENCOUNTER — Other Ambulatory Visit (HOSPITAL_COMMUNITY)
Admission: RE | Admit: 2017-03-12 | Discharge: 2017-03-12 | Disposition: A | Discharge status: Home / Self Care | Payer: Medicaid Other | Source: Ambulatory Visit | Attending: Obstetrics & Gynecology | Admitting: Obstetrics & Gynecology

## 2017-03-12 ENCOUNTER — Encounter: Payer: Self-pay | Admitting: *Deleted

## 2017-03-12 ENCOUNTER — Ambulatory Visit (INDEPENDENT_AMBULATORY_CARE_PROVIDER_SITE_OTHER): Payer: Medicaid Other | Admitting: Obstetrics & Gynecology

## 2017-03-12 VITALS — BP 110/74 | HR 87 | Wt 218.0 lb

## 2017-03-12 DIAGNOSIS — Z3480 Encounter for supervision of other normal pregnancy, unspecified trimester: Secondary | ICD-10-CM

## 2017-03-12 DIAGNOSIS — O2243 Hemorrhoids in pregnancy, third trimester: Secondary | ICD-10-CM | POA: Insufficient documentation

## 2017-03-12 MED ORDER — HYDROCORTISONE ACE-PRAMOXINE 1-1 % RE FOAM: 1.0000 | Freq: Two times a day (BID) | RECTAL | 0 refills | Status: DC

## 2017-03-12 NOTE — Progress Notes (Signed)
   PRENATAL VISIT NOTE  Subjective:  [name] is a 31 y.o. [id number] at 3349w0d being seen today for ongoing prenatal care.  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  Needs colpo pp.; Anxiety state, unspecified; Previous cesarean section complicating pregnancy, antepartum condition or complication; Supervision of other normal pregnancy, antepartum; Unwanted fertility; and Hemorrhoids during pregnancy, antepartum, third trimester on her problem list.  Patient reports painful hemorrhoid.  Contractions: Irregular. Vag. Bleeding: None.  Movement: Present. Denies leaking of fluid.   The following portions of the patient's history were reviewed and updated as appropriate: allergies, current medications, past family history, past medical history, past social history, past surgical history and problem list. Problem list updated.  Objective:   Vitals:   03/12/17 1538  BP: 110/74  Pulse: 87  Weight: 218 lb (98.9 kg)    Fetal Status: Fetal Heart Rate (bpm): 130   Movement: Present     General:  Alert, oriented and cooperative. Patient is in no acute distress.  Skin: Skin is warm and dry. No rash noted.   Cardiovascular: Normal heart rate noted  Respiratory: Normal respiratory effort, no problems with respiration noted  Abdomen: Soft, gravid, appropriate for gestational age. Pain/Pressure: Present     Pelvic:  Cervical exam performed        Extremities: Normal range of motion.     Mental Status: Normal mood and affect. Normal behavior. Normal judgment and thought content.   Assessment and Plan:  Pregnancy: [id number] at 6049w0d  1. Hemorrhoids during pregnancy, antepartum, third trimester Large posterior hemorrhoid - hydrocortisone-pramoxine (PROCTOFOAM HC) rectal foam; Place 1 applicator rectally 2 (two) times daily.  Dispense: 10 g; Refill: 0  2. Supervision of other normal pregnancy, antepartum  - Strep  Gp B NAA - Cervicovaginal ancillary only  Preterm labor symptoms and general obstetric precautions including but not limited to vaginal bleeding, contractions, leaking of fluid and fetal movement were reviewed in detail with the patient. Please refer to After Visit Summary for other counseling recommendations.  Return in about 1 week (around 03/19/2017). RCS BTL 04/02/17 39 weeks requested  Adam PhenixJames G Nagi Furio, MD

## 2017-03-12 NOTE — Progress Notes (Signed)
Please discuss Proctofoam, how to use.  Pt is having hard time due to hemorrhoids today.

## 2017-03-13 LAB — CERVICOVAGINAL ANCILLARY ONLY
Bacterial vaginitis: NEGATIVE
Candida vaginitis: NEGATIVE
Chlamydia: NEGATIVE
Neisseria Gonorrhea: NEGATIVE
Trichomonas: NEGATIVE

## 2017-03-14 LAB — STREP GP B NAA: Strep Gp B NAA: POSITIVE — AB

## 2017-03-20 ENCOUNTER — Ambulatory Visit (INDEPENDENT_AMBULATORY_CARE_PROVIDER_SITE_OTHER): Payer: Medicaid Other | Admitting: Certified Nurse Midwife

## 2017-03-20 ENCOUNTER — Encounter: Payer: Self-pay | Admitting: Certified Nurse Midwife

## 2017-03-20 VITALS — BP 110/71 | HR 101 | Temp 97.9°F | Wt 220.2 lb

## 2017-03-20 DIAGNOSIS — O99820 Streptococcus B carrier state complicating pregnancy: Secondary | ICD-10-CM

## 2017-03-20 DIAGNOSIS — Z3483 Encounter for supervision of other normal pregnancy, third trimester: Secondary | ICD-10-CM

## 2017-03-20 DIAGNOSIS — Z3480 Encounter for supervision of other normal pregnancy, unspecified trimester: Secondary | ICD-10-CM

## 2017-03-20 DIAGNOSIS — O2243 Hemorrhoids in pregnancy, third trimester: Secondary | ICD-10-CM

## 2017-03-20 DIAGNOSIS — N879 Dysplasia of cervix uteri, unspecified: Secondary | ICD-10-CM

## 2017-03-20 DIAGNOSIS — O34219 Maternal care for unspecified type scar from previous cesarean delivery: Secondary | ICD-10-CM

## 2017-03-20 DIAGNOSIS — Z3009 Encounter for other general counseling and advice on contraception: Secondary | ICD-10-CM

## 2017-03-20 NOTE — Progress Notes (Signed)
   PRENATAL VISIT NOTE  Subjective:  [name] is a 31 y.o. [id number] at 22105w1d being seen today for ongoing prenatal care.  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  Needs colpo pp.; Anxiety state, unspecified; Previous cesarean section complicating pregnancy, antepartum condition or complication; Supervision of other normal pregnancy, antepartum; Unwanted fertility; Hemorrhoids during pregnancy, antepartum, third trimester; and GBS (group B Streptococcus carrier), +RV culture, currently pregnant on her problem list.  Patient reports no complaints.  Contractions: Irregular. Vag. Bleeding: None.  Movement: Present. Denies leaking of fluid.   The following portions of the patient's history were reviewed and updated as appropriate: allergies, current medications, past family history, past medical history, past social history, past surgical history and problem list. Problem list updated.  Objective:   Vitals:   03/20/17 1548  BP: 110/71  Pulse: (!) 101  Temp: 97.9 F (36.6 C)  Weight: 220 lb 3.2 oz (99.9 kg)    Fetal Status: Fetal Heart Rate (bpm): 141 Fundal Height: 37 cm Movement: Present  Presentation: Vertex  General:  Alert, oriented and cooperative. Patient is in no acute distress.  Skin: Skin is warm and dry. No rash noted.   Cardiovascular: Normal heart rate noted  Respiratory: Normal respiratory effort, no problems with respiration noted  Abdomen: Soft, gravid, appropriate for gestational age. Pain/Pressure: Present     Pelvic:  Cervical exam performed Dilation: 1 Effacement (%): 0 Station: Ballotable  Extremities: Normal range of motion.  Edema: Trace  Mental Status: Normal mood and affect. Normal behavior. Normal judgment and thought content.   Assessment and Plan:  Pregnancy: [id number] at 53105w1d  1. Supervision of other normal pregnancy, antepartum     Doing well.  Decided against BTL;  desires IUD instead: may want pregnancy in a few years.   2. Hemorrhoids during pregnancy, antepartum, third trimester        3. 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  Needs colpo pp.     Colpo postpartum  4. GBS (group B Streptococcus carrier), +RV culture, currently pregnant     PCN if labor, repeat C-section scheduled for 04/02/17  Term labor symptoms and general obstetric precautions including but not limited to vaginal bleeding, contractions, leaking of fluid and fetal movement were reviewed in detail with the patient. Please refer to After Visit Summary for other counseling recommendations.  Return in about 1 week (around 03/27/2017) for ROB.   Roe Coombsachelle A Denney, CNM

## 2017-03-20 NOTE — Progress Notes (Signed)
Patient reports ocassional contractions, pressure and good fetal movement.

## 2017-03-26 ENCOUNTER — Encounter (HOSPITAL_COMMUNITY): Payer: Self-pay

## 2017-03-31 ENCOUNTER — Other Ambulatory Visit: Payer: Self-pay | Admitting: Obstetrics and Gynecology

## 2017-03-31 MED ORDER — SOD CITRATE-CITRIC ACID 500-334 MG/5ML PO SOLN: 30.0000 mL | ORAL | Status: DC

## 2017-03-31 MED ORDER — DEXTROSE 5 % IV SOLN: 2.0000 g | INTRAVENOUS | Status: DC

## 2017-04-01 ENCOUNTER — Encounter: Payer: Medicaid Other | Admitting: Obstetrics and Gynecology

## 2017-04-01 ENCOUNTER — Encounter (HOSPITAL_COMMUNITY)
Admission: RE | Admit: 2017-04-01 | Discharge: 2017-04-01 | Disposition: A | Discharge status: Home / Self Care | Payer: Medicaid Other | Source: Ambulatory Visit | Attending: Obstetrics and Gynecology | Admitting: Obstetrics and Gynecology

## 2017-04-01 HISTORY — DX: Unspecified abnormal cytological findings in specimens from vagina: R87.629

## 2017-04-01 LAB — CBC
HCT: 28.5 % — ABNORMAL LOW (ref 36.0–46.0)
Hemoglobin: 9.2 g/dL — ABNORMAL LOW (ref 12.0–15.0)
MCH: 26.6 pg (ref 26.0–34.0)
MCHC: 32.3 g/dL (ref 30.0–36.0)
MCV: 82.4 fL (ref 78.0–100.0)
Platelets: 338 10*3/uL (ref 150–400)
RBC: 3.46 MIL/uL — ABNORMAL LOW (ref 3.87–5.11)
RDW: 14.5 % (ref 11.5–15.5)
WBC: 12.4 10*3/uL — ABNORMAL HIGH (ref 4.0–10.5)

## 2017-04-01 LAB — TYPE AND SCREEN
ABO/RH(D): O POS
Antibody Screen: NEGATIVE

## 2017-04-01 NOTE — Patient Instructions (Signed)
18 [name]  04/01/2017   Your procedure is scheduled on:  04/02/2017  Enter through the Main Entrance of [facility] at 0830 AM.  Pick up the phone at the desk and dial [phone].   Call this number if you have problems the morning of surgery: [phone]   Remember:   Do not eat food:After Midnight.  Do not drink clear liquids: After Midnight.  Take these medicines the morning of surgery with A SIP OF WATER: none   Do not wear jewelry, make-up or nail polish.  Do not wear lotions, powders, or perfumes. Do not wear deodorant.  Do not shave 48 hours prior to surgery.  Do not bring valuables to the hospital.  [facility] is not   responsible for any belongings or valuables brought to the hospital.  Contacts, dentures or bridgework may not be worn into surgery.  Leave suitcase in the car. After surgery it may be brought to your room.  For patients admitted to the hospital, checkout time is 11:00 AM the day of              discharge.   Patients discharged the day of surgery will not be allowed to drive             home.  Name and phone number of your driver: na  Special Instructions:   N/A   Please read over the following fact sheets that you were given:   Surgical Site Infection Prevention

## 2017-04-02 ENCOUNTER — Encounter (HOSPITAL_COMMUNITY): Payer: Self-pay | Admitting: Anesthesiology

## 2017-04-02 ENCOUNTER — Encounter: Payer: Medicaid Other | Admitting: Obstetrics & Gynecology

## 2017-04-02 ENCOUNTER — Inpatient Hospital Stay (HOSPITAL_COMMUNITY): Payer: Medicaid Other | Admitting: Anesthesiology

## 2017-04-02 ENCOUNTER — Inpatient Hospital Stay (HOSPITAL_COMMUNITY)
Admission: RE | Admit: 2017-04-02 | Discharge: 2017-04-04 | DRG: 766 | Disposition: A | Discharge status: Home / Self Care | Payer: Medicaid Other | Source: Ambulatory Visit | Attending: Obstetrics and Gynecology | Admitting: Obstetrics and Gynecology

## 2017-04-02 ENCOUNTER — Encounter: Payer: Self-pay | Admitting: Obstetrics and Gynecology

## 2017-04-02 ENCOUNTER — Encounter (HOSPITAL_COMMUNITY)
Admission: RE | Disposition: A | Discharge status: Home / Self Care | Payer: Self-pay | Source: Ambulatory Visit | Attending: Obstetrics and Gynecology

## 2017-04-02 DIAGNOSIS — Z98891 History of uterine scar from previous surgery: Secondary | ICD-10-CM

## 2017-04-02 DIAGNOSIS — O99824 Streptococcus B carrier state complicating childbirth: Secondary | ICD-10-CM | POA: Diagnosis not present

## 2017-04-02 DIAGNOSIS — D649 Anemia, unspecified: Secondary | ICD-10-CM | POA: Diagnosis present

## 2017-04-02 DIAGNOSIS — O9081 Anemia of the puerperium: Secondary | ICD-10-CM | POA: Diagnosis present

## 2017-04-02 DIAGNOSIS — Z3A39 39 weeks gestation of pregnancy: Secondary | ICD-10-CM

## 2017-04-02 DIAGNOSIS — O34211 Maternal care for low transverse scar from previous cesarean delivery: Principal | ICD-10-CM | POA: Diagnosis present

## 2017-04-02 LAB — RPR: RPR Ser Ql: NONREACTIVE

## 2017-04-02 SURGERY — Surgical Case
Anesthesia: Spinal

## 2017-04-02 MED ORDER — OXYCODONE HCL 5 MG PO TABS
10.0000 mg | ORAL_TABLET | ORAL | Status: DC | PRN
Start: 2017-04-02 — End: 2017-04-04

## 2017-04-02 MED ORDER — LACTATED RINGERS IV SOLN
INTRAVENOUS | Status: DC
  Administered 2017-04-02 (×2): via INTRAVENOUS

## 2017-04-02 MED ORDER — SIMETHICONE 80 MG PO CHEW: 80.0000 mg | CHEWABLE_TABLET | ORAL | Status: DC | PRN

## 2017-04-02 MED ORDER — SIMETHICONE 80 MG PO CHEW
80.0000 mg | CHEWABLE_TABLET | ORAL | Status: DC
  Administered 2017-04-02 – 2017-04-03 (×2): 80 mg via ORAL
  Filled 2017-04-02 (×2): qty 1

## 2017-04-02 MED ORDER — KETOROLAC TROMETHAMINE 30 MG/ML IJ SOLN
INTRAMUSCULAR | Status: AC
  Filled 2017-04-02: qty 1

## 2017-04-02 MED ORDER — MEPERIDINE HCL 25 MG/ML IJ SOLN: 6.2500 mg | INTRAMUSCULAR | Status: DC | PRN

## 2017-04-02 MED ORDER — OXYTOCIN 40 UNITS IN LACTATED RINGERS INFUSION - SIMPLE MED
INTRAVENOUS | Status: DC | PRN
  Administered 2017-04-02: 40 [IU] via INTRAVENOUS

## 2017-04-02 MED ORDER — KETOROLAC TROMETHAMINE 30 MG/ML IJ SOLN: 30.0000 mg | Freq: Once | INTRAMUSCULAR | Status: DC | PRN

## 2017-04-02 MED ORDER — IBUPROFEN 600 MG PO TABS
600.0000 mg | ORAL_TABLET | Freq: Four times a day (QID) | ORAL | Status: DC
  Administered 2017-04-02 – 2017-04-04 (×7): 600 mg via ORAL
  Filled 2017-04-02 (×7): qty 1

## 2017-04-02 MED ORDER — LACTATED RINGERS IV SOLN
INTRAVENOUS | Status: DC
  Administered 2017-04-02: 10:00:00 via INTRAVENOUS

## 2017-04-02 MED ORDER — ONDANSETRON HCL 4 MG/2ML IJ SOLN
INTRAMUSCULAR | Status: AC
  Filled 2017-04-02: qty 2

## 2017-04-02 MED ORDER — ACETAMINOPHEN 325 MG PO TABS: 650.0000 mg | ORAL_TABLET | ORAL | Status: DC | PRN

## 2017-04-02 MED ORDER — DIPHENHYDRAMINE HCL 25 MG PO CAPS: 25.0000 mg | ORAL_CAPSULE | Freq: Four times a day (QID) | ORAL | Status: DC | PRN

## 2017-04-02 MED ORDER — BUPIVACAINE IN DEXTROSE 0.75-8.25 % IT SOLN
INTRATHECAL | Status: DC | PRN
  Administered 2017-04-02: 1.4 mL via INTRATHECAL

## 2017-04-02 MED ORDER — ONDANSETRON HCL 4 MG/2ML IJ SOLN
4.0000 mg | Freq: Three times a day (TID) | INTRAMUSCULAR | Status: DC | PRN
  Administered 2017-04-02: 4 mg via INTRAVENOUS
  Filled 2017-04-02: qty 2

## 2017-04-02 MED ORDER — FENTANYL CITRATE (PF) 100 MCG/2ML IJ SOLN
INTRAMUSCULAR | Status: AC
  Filled 2017-04-02: qty 2

## 2017-04-02 MED ORDER — DEXAMETHASONE SODIUM PHOSPHATE 4 MG/ML IJ SOLN
INTRAMUSCULAR | Status: AC
  Filled 2017-04-02: qty 1

## 2017-04-02 MED ORDER — NALBUPHINE HCL 10 MG/ML IJ SOLN
5.0000 mg | INTRAMUSCULAR | Status: DC | PRN
  Administered 2017-04-03: 5 mg via INTRAVENOUS
  Filled 2017-04-02: qty 1

## 2017-04-02 MED ORDER — DIPHENHYDRAMINE HCL 50 MG/ML IJ SOLN
INTRAMUSCULAR | Status: DC | PRN
  Administered 2017-04-02: 12.5 mg via INTRAVENOUS

## 2017-04-02 MED ORDER — SODIUM CHLORIDE 0.9 % IR SOLN
Status: DC | PRN
  Administered 2017-04-02: 1000 mL

## 2017-04-02 MED ORDER — DIPHENHYDRAMINE HCL 50 MG/ML IJ SOLN: 12.5000 mg | INTRAMUSCULAR | Status: DC | PRN

## 2017-04-02 MED ORDER — NALBUPHINE HCL 10 MG/ML IJ SOLN: 5.0000 mg | Freq: Once | INTRAMUSCULAR | Status: AC | PRN

## 2017-04-02 MED ORDER — MENTHOL 3 MG MT LOZG: 1.0000 | LOZENGE | OROMUCOSAL | Status: DC | PRN

## 2017-04-02 MED ORDER — CEFAZOLIN SODIUM-DEXTROSE 2-4 GM/100ML-% IV SOLN
2.0000 g | INTRAVENOUS | Status: AC
  Administered 2017-04-02: 2 g via INTRAVENOUS
  Filled 2017-04-02: qty 100

## 2017-04-02 MED ORDER — SCOPOLAMINE 1 MG/3DAYS TD PT72
1.0000 | MEDICATED_PATCH | Freq: Once | TRANSDERMAL | Status: DC
  Filled 2017-04-02: qty 1

## 2017-04-02 MED ORDER — OXYCODONE HCL 5 MG PO TABS
5.0000 mg | ORAL_TABLET | ORAL | Status: DC | PRN
Start: 2017-04-02 — End: 2017-04-04
  Administered 2017-04-04 (×3): 5 mg via ORAL
  Filled 2017-04-02 (×3): qty 1

## 2017-04-02 MED ORDER — SENNOSIDES-DOCUSATE SODIUM 8.6-50 MG PO TABS
2.0000 | ORAL_TABLET | ORAL | Status: DC
  Administered 2017-04-02 – 2017-04-03 (×2): 2 via ORAL
  Filled 2017-04-02 (×2): qty 2

## 2017-04-02 MED ORDER — DIBUCAINE 1 % RE OINT: 1.0000 "application " | TOPICAL_OINTMENT | RECTAL | Status: DC | PRN

## 2017-04-02 MED ORDER — SODIUM CHLORIDE 0.9% FLUSH
3.0000 mL | INTRAVENOUS | Status: DC | PRN
Start: 2017-04-02 — End: 2017-04-04

## 2017-04-02 MED ORDER — SOD CITRATE-CITRIC ACID 500-334 MG/5ML PO SOLN
30.0000 mL | ORAL | Status: AC
  Administered 2017-04-02: 30 mL via ORAL
  Filled 2017-04-02: qty 15

## 2017-04-02 MED ORDER — SIMETHICONE 80 MG PO CHEW
80.0000 mg | CHEWABLE_TABLET | Freq: Three times a day (TID) | ORAL | Status: DC
  Administered 2017-04-03 – 2017-04-04 (×3): 80 mg via ORAL
  Filled 2017-04-02 (×3): qty 1

## 2017-04-02 MED ORDER — KETOROLAC TROMETHAMINE 30 MG/ML IJ SOLN: 30.0000 mg | Freq: Four times a day (QID) | INTRAMUSCULAR | Status: DC | PRN

## 2017-04-02 MED ORDER — OXYTOCIN 40 UNITS IN LACTATED RINGERS INFUSION - SIMPLE MED
2.5000 [IU]/h | INTRAVENOUS | Status: AC
Start: 2017-04-02 — End: 2017-04-03

## 2017-04-02 MED ORDER — SCOPOLAMINE 1 MG/3DAYS TD PT72
1.0000 | MEDICATED_PATCH | Freq: Once | TRANSDERMAL | Status: DC
  Administered 2017-04-02: 1.5 mg via TRANSDERMAL
  Filled 2017-04-02: qty 1

## 2017-04-02 MED ORDER — ONDANSETRON HCL 4 MG/2ML IJ SOLN
INTRAMUSCULAR | Status: DC | PRN
  Administered 2017-04-02: 4 mg via INTRAVENOUS

## 2017-04-02 MED ORDER — NALBUPHINE HCL 10 MG/ML IJ SOLN
5.0000 mg | Freq: Once | INTRAMUSCULAR | Status: AC | PRN
  Administered 2017-04-02: 5 mg via SUBCUTANEOUS

## 2017-04-02 MED ORDER — NALOXONE HCL 2 MG/2ML IJ SOSY
1.0000 ug/kg/h | PREFILLED_SYRINGE | INTRAMUSCULAR | Status: DC | PRN
  Filled 2017-04-02: qty 2

## 2017-04-02 MED ORDER — HYDROMORPHONE HCL 1 MG/ML IJ SOLN: 0.2500 mg | INTRAMUSCULAR | Status: DC | PRN

## 2017-04-02 MED ORDER — IBUPROFEN 600 MG PO TABS
600.0000 mg | ORAL_TABLET | Freq: Four times a day (QID) | ORAL | Status: DC | PRN
  Filled 2017-04-02: qty 1

## 2017-04-02 MED ORDER — MORPHINE SULFATE (PF) 0.5 MG/ML IJ SOLN
INTRAMUSCULAR | Status: DC | PRN
  Administered 2017-04-02: .2 mg via INTRATHECAL

## 2017-04-02 MED ORDER — METOCLOPRAMIDE HCL 5 MG/ML IJ SOLN
INTRAMUSCULAR | Status: DC | PRN
  Administered 2017-04-02: 10 mg via INTRAVENOUS

## 2017-04-02 MED ORDER — DIPHENHYDRAMINE HCL 50 MG/ML IJ SOLN
INTRAMUSCULAR | Status: AC
  Filled 2017-04-02: qty 1

## 2017-04-02 MED ORDER — PROMETHAZINE HCL 25 MG/ML IJ SOLN: 6.2500 mg | INTRAMUSCULAR | Status: DC | PRN

## 2017-04-02 MED ORDER — LACTATED RINGERS IV SOLN
INTRAVENOUS | Status: DC
  Administered 2017-04-02: 22:00:00 via INTRAVENOUS

## 2017-04-02 MED ORDER — PRENATAL MULTIVITAMIN CH
1.0000 | ORAL_TABLET | Freq: Every day | ORAL | Status: DC
  Administered 2017-04-03 – 2017-04-04 (×2): 1 via ORAL
  Filled 2017-04-02 (×2): qty 1

## 2017-04-02 MED ORDER — PHENYLEPHRINE HCL 10 MG/ML IJ SOLN
INTRAMUSCULAR | Status: DC | PRN
  Administered 2017-04-02 (×2): 80 ug via INTRAVENOUS

## 2017-04-02 MED ORDER — FENTANYL CITRATE (PF) 100 MCG/2ML IJ SOLN
INTRAMUSCULAR | Status: DC | PRN
  Administered 2017-04-02: 20 ug via INTRATHECAL

## 2017-04-02 MED ORDER — NALOXONE HCL 0.4 MG/ML IJ SOLN: 0.4000 mg | INTRAMUSCULAR | Status: DC | PRN

## 2017-04-02 MED ORDER — TETANUS-DIPHTH-ACELL PERTUSSIS 5-2.5-18.5 LF-MCG/0.5 IM SUSP: 0.5000 mL | Freq: Once | INTRAMUSCULAR | Status: DC

## 2017-04-02 MED ORDER — PHENYLEPHRINE 8 MG IN D5W 100 ML (0.08MG/ML) PREMIX OPTIME
INJECTION | INTRAVENOUS | Status: DC | PRN
  Administered 2017-04-02: 60 ug/min via INTRAVENOUS

## 2017-04-02 MED ORDER — METOCLOPRAMIDE HCL 5 MG/ML IJ SOLN
INTRAMUSCULAR | Status: AC
  Filled 2017-04-02: qty 2

## 2017-04-02 MED ORDER — NALBUPHINE HCL 10 MG/ML IJ SOLN
5.0000 mg | INTRAMUSCULAR | Status: DC | PRN
  Filled 2017-04-02: qty 1

## 2017-04-02 MED ORDER — MORPHINE SULFATE (PF) 0.5 MG/ML IJ SOLN
INTRAMUSCULAR | Status: AC
  Filled 2017-04-02: qty 10

## 2017-04-02 MED ORDER — WITCH HAZEL-GLYCERIN EX PADS: 1.0000 "application " | MEDICATED_PAD | CUTANEOUS | Status: DC | PRN

## 2017-04-02 MED ORDER — PHENYLEPHRINE 40 MCG/ML (10ML) SYRINGE FOR IV PUSH (FOR BLOOD PRESSURE SUPPORT)
PREFILLED_SYRINGE | INTRAVENOUS | Status: AC
  Filled 2017-04-02: qty 10

## 2017-04-02 MED ORDER — PHENYLEPHRINE 8 MG IN D5W 100 ML (0.08MG/ML) PREMIX OPTIME
INJECTION | INTRAVENOUS | Status: AC
  Filled 2017-04-02: qty 100

## 2017-04-02 MED ORDER — COCONUT OIL OIL
1.0000 "application " | TOPICAL_OIL | Status: DC | PRN
  Administered 2017-04-04: 1 via TOPICAL
  Filled 2017-04-02: qty 120

## 2017-04-02 MED ORDER — LACTATED RINGERS IV SOLN
INTRAVENOUS | Status: DC | PRN
  Administered 2017-04-02: 12:00:00 via INTRAVENOUS

## 2017-04-02 MED ORDER — OXYTOCIN 10 UNIT/ML IJ SOLN
INTRAMUSCULAR | Status: AC
  Filled 2017-04-02: qty 4

## 2017-04-02 MED ORDER — DIPHENHYDRAMINE HCL 25 MG PO CAPS
25.0000 mg | ORAL_CAPSULE | ORAL | Status: DC | PRN
  Administered 2017-04-02: 25 mg via ORAL
  Filled 2017-04-02: qty 1

## 2017-04-02 MED ORDER — NALBUPHINE HCL 10 MG/ML IJ SOLN
INTRAMUSCULAR | Status: AC
  Filled 2017-04-02: qty 1

## 2017-04-02 MED ORDER — ZOLPIDEM TARTRATE 5 MG PO TABS: 5.0000 mg | ORAL_TABLET | Freq: Every evening | ORAL | Status: DC | PRN

## 2017-04-02 MED ORDER — ACETAMINOPHEN 500 MG PO TABS
1000.0000 mg | ORAL_TABLET | Freq: Four times a day (QID) | ORAL | Status: AC
  Administered 2017-04-02 – 2017-04-03 (×4): 1000 mg via ORAL
  Filled 2017-04-02 (×4): qty 2

## 2017-04-02 MED ORDER — KETOROLAC TROMETHAMINE 30 MG/ML IJ SOLN
30.0000 mg | Freq: Four times a day (QID) | INTRAMUSCULAR | Status: DC | PRN
  Administered 2017-04-02: 30 mg via INTRAMUSCULAR

## 2017-04-02 MED ORDER — DEXAMETHASONE SODIUM PHOSPHATE 4 MG/ML IJ SOLN
INTRAMUSCULAR | Status: DC | PRN
  Administered 2017-04-02: 4 mg via INTRAVENOUS

## 2017-04-02 SURGICAL SUPPLY — 36 items
ADH SKN CLS APL DERMABOND .7 (GAUZE/BANDAGES/DRESSINGS) ×1
ADH SKN CLS LQ APL DERMABOND (GAUZE/BANDAGES/DRESSINGS) ×1
CANISTER SUCT 3000ML PPV (MISCELLANEOUS) ×3 IMPLANT
CHLORAPREP W/TINT 26ML (MISCELLANEOUS) ×3 IMPLANT
DERMABOND ADHESIVE PROPEN (GAUZE/BANDAGES/DRESSINGS) ×2
DERMABOND ADVANCED (GAUZE/BANDAGES/DRESSINGS) ×2
DERMABOND ADVANCED .7 DNX12 (GAUZE/BANDAGES/DRESSINGS) ×1 IMPLANT
DERMABOND ADVANCED .7 DNX6 (GAUZE/BANDAGES/DRESSINGS) IMPLANT
DRSG OPSITE POSTOP 4X10 (GAUZE/BANDAGES/DRESSINGS) ×3 IMPLANT
ELECT REM PT RETURN 9FT ADLT (ELECTROSURGICAL) ×3
ELECTRODE REM PT RTRN 9FT ADLT (ELECTROSURGICAL) ×1 IMPLANT
GLOVE BIOGEL PI IND STRL 7.0 (GLOVE) ×2 IMPLANT
GLOVE BIOGEL PI IND STRL 7.5 (GLOVE) ×1 IMPLANT
GLOVE BIOGEL PI INDICATOR 7.0 (GLOVE) ×4
GLOVE BIOGEL PI INDICATOR 7.5 (GLOVE) ×2
GLOVE SKINSENSE NS SZ7.0 (GLOVE) ×2
GLOVE SKINSENSE STRL SZ7.0 (GLOVE) ×1 IMPLANT
GOWN STRL REUS W/ TWL LRG LVL3 (GOWN DISPOSABLE) ×2 IMPLANT
GOWN STRL REUS W/ TWL XL LVL3 (GOWN DISPOSABLE) ×1 IMPLANT
GOWN STRL REUS W/TWL LRG LVL3 (GOWN DISPOSABLE) ×6
GOWN STRL REUS W/TWL XL LVL3 (GOWN DISPOSABLE) ×3
NS IRRIG 1000ML POUR BTL (IV SOLUTION) ×3 IMPLANT
PACK C SECTION WH (CUSTOM PROCEDURE TRAY) ×3 IMPLANT
PAD ABD 8X7 1/2 STERILE (GAUZE/BANDAGES/DRESSINGS) ×2 IMPLANT
PAD OB MATERNITY 4.3X12.25 (PERSONAL CARE ITEMS) ×3 IMPLANT
PAD PREP 24X48 CUFFED NSTRL (MISCELLANEOUS) ×3 IMPLANT
RTRCTR C-SECT PINK 25CM LRG (MISCELLANEOUS) ×2 IMPLANT
SPONGE GAUZE 4X4 12PLY STER LF (GAUZE/BANDAGES/DRESSINGS) ×4 IMPLANT
SPONGE LAP 18X18 X RAY DECT (DISPOSABLE) ×2 IMPLANT
SUT MON AB 4-0 PS1 27 (SUTURE) ×3 IMPLANT
SUT MON AB-0 CT1 36 (SUTURE) ×6 IMPLANT
SUT PLAIN 2 0 (SUTURE) ×3
SUT PLAIN ABS 2-0 CT1 27XMFL (SUTURE) ×1 IMPLANT
SUT VIC AB 0 CT1 36 (SUTURE) ×6 IMPLANT
SUT VIC AB 3-0 CT1 27 (SUTURE) ×3
SUT VIC AB 3-0 CT1 TAPERPNT 27 (SUTURE) ×1 IMPLANT

## 2017-04-02 NOTE — Lactation Note (Signed)
This note was copied from a baby's chart. Lactation Consultation Note  Patient Name: [name] Today's Date: 04/02/2017 Reason for consult: Initial assessment   Initial assessment with mom of 1 hour old infant in PACU. Mom drowsy with assessment. Mom reports she BF her 33 and 31 yo for 3-5 days. She reports she plans to try BF but may pump and may want to do breast and bottle feeding. Discussed supply and demand and need for frequent breast stimulation to induce and protect milk supply.   Mom with large soft compressible breasts and areola, colostrum not expressible at this time. Infant placed STS with mom with mom's permission. Assisted infant in latching to left breast in the laid back cross cradle hold. Infant latched easily, mom voiced pain with initial latch that improved after several suckles. Infant was still feeding when I left the room. Infant was noted to have intermittent swallows. Mom was massaging breast with feeding.   Enc mom to feed infant STS 8-12 x in 24 hours at first feeding cues. BF basics, positioning, colostrum, milk coming to volume, supply and demand, I/O pillow and head support reviewed. Feeding log given with instructions for use.   BF Resources Handout and LC Brochure given, mom informed of IP/OP Services, BF Support Groups and LC phone #. Enc mom to call out for feeding assistance as needed. Mom is a [facility] client and is aware to call and make appt post d/c. Parents without further questions/concerns at this time.    Maternal Data Formula Feeding for Exclusion: No Has patient been taught Hand Expression?: No Does the patient have breastfeeding experience prior to this delivery?: Yes  Feeding Feeding Type: Breast Fed Length of feed: 8 min (still feeding when LC left room)  LATCH Score/Interventions Latch: Grasps breast easily, tongue down, lips flanged, rhythmical sucking.  Audible Swallowing: A few with stimulation Intervention(s): Skin to skin;Hand  expression;Alternate breast massage  Type of Nipple: Everted at rest and after stimulation  Comfort (Breast/Nipple): Filling, red/small blisters or bruises, mild/mod discomfort (with initial latch)  Problem noted: Mild/Moderate discomfort  Hold (Positioning): Assistance needed to correctly position infant at breast and maintain latch. Intervention(s): Breastfeeding basics reviewed;Support Pillows;Position options;Skin to skin  LATCH Score: 7  Lactation Tools Discussed/Used WIC Program: Yes   Consult Status Consult Status: Follow-up Date: 04/03/17 Follow-up type: In-patient    Silas Flood Rahmel Nedved 04/02/2017, 1:07 PM

## 2017-04-02 NOTE — Anesthesia Preprocedure Evaluation (Signed)
Anesthesia Evaluation  Patient identified by MRN, date of birth, ID band Patient awake    Reviewed: Allergy & Precautions, H&P , NPO status , Patient's Chart, lab work & pertinent test results  History of Anesthesia Complications Negative for: history of anesthetic complications  Airway Mallampati: II  TM Distance: >3 FB Neck ROM: Full    Dental no notable dental hx. (+) Teeth Intact   Pulmonary neg pulmonary ROS,    Pulmonary exam normal        Cardiovascular negative cardio ROS Normal cardiovascular exam     Neuro/Psych negative neurological ROS     GI/Hepatic negative GI ROS, Neg liver ROS,   Endo/Other  negative endocrine ROS  Renal/GU History of kidney stones     Musculoskeletal negative musculoskeletal ROS (+)   Abdominal (+) + obese,   Peds  Hematology  (+) anemia ,   Anesthesia Other Findings   Reproductive/Obstetrics (+) Pregnancy                             Anesthesia Physical  Anesthesia Plan  ASA: II  Anesthesia Plan: Spinal   Post-op Pain Management:    Induction:   Airway Management Planned:   Additional Equipment:   Intra-op Plan:   Post-operative Plan:   Informed Consent: I have reviewed the patients History and Physical, chart, labs and discussed the procedure including the risks, benefits and alternatives for the proposed anesthesia with the patient or authorized representative who has indicated his/her understanding and acceptance.     Plan Discussed with: CRNA and Surgeon  Anesthesia Plan Comments:         Anesthesia Quick Evaluation

## 2017-04-02 NOTE — Consult Note (Signed)
Neonatology Note:   Attendance at C-section:    I was asked by Dr. Vergie Living to attend this repeat C/S at term. The mother is a G4P2A1 O pos, GBS positive with obesity, otherwise uncomplicated pregnancy. ROM at delivery, fluid clear. True knot in the cord. Infant vigorous with good spontaneous cry and tone. Delayed cord clamping was done. Needed only minimal bulb suctioning. Ap 9/9. Lungs clear to ausc in DR. To CN to care of Pediatrician.   Doretha Sou, MD

## 2017-04-02 NOTE — Op Note (Signed)
Operative Note   SURGERY DATE: 04/02/2017  PRE-OP DIAGNOSIS:  *Pregnancy @ 39 wks * Previous c-section x2  POST-OP DIAGNOSIS: same   PROCEDURE: repeat low transverse cesarean section via pfannenstiel skin incision with double layer uterine closure  SURGEON: Surgeon(s) and Role:    * Mayflower Bing, MD   ASSISTANT:    Lorne Skeens, MD  ANESTHESIA: spinal  ESTIMATED BLOOD LOSS:  DRAINS: 300 mL UOP via indwelling foley  VTE PROPHYLAXIS: SCDs to bilateral lower extremities  ANTIBIOTICS: Two grams of Cefazolin were given., within 1 hour of skin incision  SPECIMENS: none  COMPLICATIONS: none  FINDINGS: Of note, no intra-abdominal adhesions were noted. Grossly normal uterus, tubes and ovaries. clear amniotic fluid, cephalic female infant, weight 3640g, APGARs 9/9, intact placenta.   PROCEDURE IN DETAIL: The patient was taken to the operating room where anesthesia was administered and normal fetal heart tones were confirmed. She was then prepped and draped in the normal fashion in the dorsal supine position with a leftward tilt.  After a time out was performed, a pfannensteil skin incision through the same incision was made with the scalpel and carried through to the underlying layer of fascia. The fascia was then incised at the midline and this incision was extended laterally with the mayo scissors. Attention was turned to the superior aspect of the fascial incision which was grasped with the kocher clamps x 2, tented up and the rectus muscles were dissected off with the abdominal wall. In a similar fashion the inferior aspect of the fascial incision was grasped with the kocher clamps, tented up and the rectus muscles dissected off with the mayo scissors. The rectus muscles were then separated in the midline and the peritoneum was entered bluntly. The bladder blade was inserted and the vesicouterine peritoneum was identified, tented up and entered with the metzenbaum  scissors. This incision was extended laterally and the bladder flap was created digitally. The bladder blade was reinserted.  A low transverse hysterotomy was made with the scalpel until the endometrial cavity was breached and the amniotic sac yielding clear amniotic fluid. This incision was extended bluntly and the infant's head, shoulders and body were delivered atraumatically.The cord was clamped x 2 and cut, and the infant was handed to the awaiting pediatricians, after delayed cord clamping was done.  The placenta was then gradually expressed from the uterus and then the uterus was exteriorized and cleared of all clots and debris. The hysterotomy was repaired with a running suture of 1-0 . A second imbricating layer of 1-0  suture was then placed.   The uterus and adnexa were then returned to the abdomen, and the hysterotomy and all operative sites were reinspected and excellent hemostasis was noted after irrigation and suction of the abdomen with warm saline.  The peritoneum was closed with a running stitch of 3-0 Vicryl. The fascia was reapproximated with 0 Vicryl in a simple running fashion bilaterally. The skin was then closed with 4-0 monocryl, in a subcuticular fashion.  The patient  tolerated the procedure well. Sponge, lap, needle, and instrument counts were correct x 2. The patient was transferred to the recovery room awake, alert and breathing independently in stable condition.  Ernestina Penna MD Center for [facility])    Agree with above. I was present and scrubbed for the entire procedure.   Cornelia Copa MD Attending Center for [employer] [facility])

## 2017-04-02 NOTE — H&P (Signed)
Obstetrics Admission History & Physical  04/02/2017 - 10:23 AM Primary OBGYN: [facility]  Chief Complaint: scheduled c-section  History of Present Illness  31 y.o. [id number] @ [redacted]w[redacted]d , with the above CC. Pregnancy complicated by: BMI 37, h/o c-section x 2, h/o LEEP, GBS pos  Ms. VIRGEN BELLAND states that she has no complaints, problems or issues.   Review of Systems: as noted in the History of Present Illness.   PMHx:  Past Medical History:  Diagnosis Date  . CIN III (cervical intraepithelial neoplasia III)   . History of kidney stones   . Vaginal Pap smear, abnormal   . Wears glasses    PSHx:  Past Surgical History:  Procedure Laterality Date  . CESAREAN SECTION  12-23-2002  . CESAREAN SECTION N/A 04/07/2014   Procedure: CESAREAN SECTION;  Surgeon: Michael Litter, MD;  Location: [facility];  Service: Obstetrics;  Laterality: N/A;  . COLPOSCOPY    . LEEP N/A 09/14/2014   Procedure: LOOP ELECTROSURGICAL EXCISION PROCEDURE (LEEP);  Surgeon: Bernita Buffy. Duard Brady, MD;  Location: [facility];  Service: Gynecology;  Laterality: N/A;  . WISDOM TOOTH EXTRACTION  2012   Medications:  Facility-Administered Medications Prior to Admission  Medication Dose Route Frequency Provider Last Rate Last Dose  . ceFAZolin (ANCEF) 2 g in dextrose 5 % 100 mL IVPB  2 g Intravenous 30 min Pre-Op Collins Bing, MD      . sodium citrate-citric acid (ORACIT) solution 30 mL  30 mL Oral 30 min Pre-Op Rudolph Bing, MD       Prescriptions Prior to Admission  Medication Sig Dispense Refill Last Dose  . acetaminophen (TYLENOL) 500 MG tablet Take 500 mg by mouth every 6 (six) hours as needed for mild pain.    Not Taking  . Prenatal Vit-Fe Fumarate-FA (PRENATAL MULTIVITAMIN) TABS tablet Take 1 tablet by mouth daily at 12 noon.   Taking  . hydrocortisone-pramoxine (PROCTOFOAM HC) rectal foam Place 1 applicator rectally 2 (two) times daily. (Patient not taking: Reported on 03/20/2017)  10 g 0 Not Taking  . triamcinolone ointment (KENALOG) 0.5 % Apply 1 application topically 2 (two) times daily. (Patient not taking: Reported on 02/12/2017) 90 g PRN Not Taking     Allergies: has No Known Allergies. OBHx:  OB History  Gravida Para Term Preterm AB Living  SAB TAB Ectopic Multiple Live Births  1       2    # Outcome Date GA Lbr Len/2nd Weight Sex Delivery Anes PTL Lv  4 Current           3 Term 04/07/14 [redacted]w[redacted]d  3.955 kg (8 lb 11.5 oz) F CS-LTranv   LIV     Birth Comments: No problems at birth  2 SAB 10/31/11          1 Term 12/23/02   3.969 kg (8 lb 12 oz) M CS-LTranv  N LIV     Birth Comments: pushed- pt states 23hr                FHx:  Family History  Problem Relation Age of Onset  . Anesthesia problems Neg Hx   . Alcohol abuse Neg Hx    Soc Hx:  Social History   Social History  . Marital status: Single    Spouse name: N/A  . Number of children: N/A  . Years of education: N/A   Occupational History  .  Not on file.   Social History Main Topics  . Smoking status: Never Smoker  . Smokeless tobacco: Never Used  . Alcohol use No     Comment: rarely  . Drug use: No  . Sexual activity: Yes    Birth control/ protection: None   Other Topics Concern  . Not on file   Social History Narrative   ** Merged History Encounter **        Objective    Current Vital Signs 24h Vital Sign Ranges  T 98.4 F (36.9 C) Temp  Avg: 98.4 F (36.9 C)  Min: 98.4 F (36.9 C)  Max: 98.4 F (36.9 C)  BP   No Data Recorded  HR 81 Pulse  Avg: 81  Min: 81  Max: 81  RR   No Data Recorded  SaO2 99 % Not Delivered SpO2  Avg: 99 %  Min: 99 %  Max: 99 %       24 Hour I/O Current Shift I/O  Time Ins Outs No intake/output data recorded. No intake/output data recorded.    General: Well nourished, well developed female in no acute distress.  Skin:  Warm and dry.  Cardiovascular: S1, S2 normal, no murmur, rub or gallop, regular rate and rhythm Respiratory:   Clear to auscultation bilateral. Normal respiratory effort Abdomen: gravid, well healed low transverse incision Neuro/Psych:  Normal mood and affect.    Labs  O POS  Recent Labs Lab 04/01/17 0930  WBC 12.4*  HGB 9.2*  HCT 28.5*  PLT 338   Radiology Posterior placenta  Perinatal info  O POS/ Rubella  Not immune / Varicella Unknown/RPR neg/HIV negative/HepB Surf Ag negative/TDaP:no /pap neg 2017/  Assessment & Plan   30 y.o. [id number] @ [redacted]w[redacted]d and doing well For c-section. No antibodies and common ABO so won't type and cross. Does NOT desire BTL. Follow up admit BP. No adhesions noted on last op note.   Cornelia Copa MD Attending Center for [facility])

## 2017-04-02 NOTE — Anesthesia Postprocedure Evaluation (Signed)
Anesthesia Post Note  Patient: [name]  Procedure(s) Performed: Procedure(s) (LRB): CESAREAN SECTION (N/A)  Patient location during evaluation: PACU Anesthesia Type: Spinal Level of consciousness: awake Pain management: pain level controlled Vital Signs Assessment: post-procedure vital signs reviewed and stable Respiratory status: spontaneous breathing Cardiovascular status: stable Postop Assessment: no headache, no backache, spinal receding, patient able to bend at knees and no signs of nausea or vomiting Anesthetic complications: no        Last Vitals:  Vitals:   04/02/17 1315 04/02/17 1330  BP: 114/79 123/84  Pulse: 77 65  Resp: (!) 23 14  Temp:      Last Pain:  Vitals:   04/02/17 1246  TempSrc: Oral   Pain Goal:                 Kiley Solimine JR,JOHN Monalisa Bayless

## 2017-04-02 NOTE — Transfer of Care (Signed)
Immediate Anesthesia Transfer of Care Note  Patient: [name]  Procedure(s) Performed: Procedure(s): CESAREAN SECTION (N/A)  Patient Location: PACU  Anesthesia Type:Spinal  Level of Consciousness: awake, alert  and oriented  Airway & Oxygen Therapy: Patient Spontanous Breathing  Post-op Assessment: Report given to RN and Post -op Vital signs reviewed and stable  Post vital signs: Reviewed and stable  Last Vitals:  Vitals:   04/02/17 0910 04/02/17 1026  BP:  (!) 143/69  Pulse: 81 78  Resp:  18  Temp: 36.9 C     Last Pain:  Vitals:   04/02/17 0910  TempSrc: Oral         Complications: No apparent anesthesia complications

## 2017-04-02 NOTE — Anesthesia Procedure Notes (Signed)
Spinal  Patient location during procedure: OR Start time: 04/02/2017 11:23 AM End time: 04/02/2017 11:27 AM Staffing Anesthesiologist: Leilani Able Performed: anesthesiologist  Preanesthetic Checklist Completed: patient identified, surgical consent, pre-op evaluation, timeout performed, IV checked, risks and benefits discussed and monitors and equipment checked Spinal Block Patient position: sitting Prep: site prepped and draped and DuraPrep Patient monitoring: heart rate, cardiac monitor, continuous pulse ox and blood pressure Approach: midline Location: L3-4 Injection technique: single-shot Needle Needle type: Pencan  Needle gauge: 24 G Needle length: 9 cm Needle insertion depth: 7 cm Assessment Sensory level: T4

## 2017-04-03 LAB — CBC
HCT: 23 % — ABNORMAL LOW (ref 36.0–46.0)
Hemoglobin: 7.5 g/dL — ABNORMAL LOW (ref 12.0–15.0)
MCH: 27 pg (ref 26.0–34.0)
MCHC: 32.6 g/dL (ref 30.0–36.0)
MCV: 82.7 fL (ref 78.0–100.0)
Platelets: 273 10*3/uL (ref 150–400)
RBC: 2.78 MIL/uL — ABNORMAL LOW (ref 3.87–5.11)
RDW: 14.4 % (ref 11.5–15.5)
WBC: 17.5 10*3/uL — ABNORMAL HIGH (ref 4.0–10.5)

## 2017-04-03 LAB — BIRTH TISSUE RECOVERY COLLECTION (PLACENTA DONATION)

## 2017-04-03 MED ORDER — POLYSACCHARIDE IRON COMPLEX 150 MG PO CAPS
150.0000 mg | ORAL_CAPSULE | Freq: Two times a day (BID) | ORAL | Status: DC
  Administered 2017-04-03 – 2017-04-04 (×3): 150 mg via ORAL
  Filled 2017-04-03 (×3): qty 1

## 2017-04-03 NOTE — Lactation Note (Signed)
This note was copied from a baby's chart. Lactation Consultation Note  Patient Name: [name] [id number] Date: 04/03/2017 Reason for consult: Follow-up assessment  Baby 29 hours old. Mom reports that baby latching fine to left breast, but nipple sore. Baby latched and nursing on the tip of left nipple when this LC entered the room. Assisted mom to reposition baby and support baby's head in cross-cradle position, and mom reported increased comfort. Assisted mom with hand expression with colostrum flowing, and mom given hand pump with review.   Maternal Data    Feeding Feeding Type: Breast Fed Length of feed:  (LC assessed 10 minutes of BF.)  LATCH Score/Interventions Latch: Grasps breast easily, tongue down, lips flanged, rhythmical sucking.  Audible Swallowing: A few with stimulation Intervention(s): Skin to skin;Hand expression  Type of Nipple: Everted at rest and after stimulation  Comfort (Breast/Nipple): Filling, red/small blisters or bruises, mild/mod discomfort  Problem noted: Mild/Moderate discomfort  Hold (Positioning): Assistance needed to correctly position infant at breast and maintain latch. Intervention(s): Breastfeeding basics reviewed;Support Pillows;Position options;Skin to skin  LATCH Score: 7  Lactation Tools Discussed/Used     Consult Status Consult Status: Follow-up Date: 04/04/17 Follow-up type: In-patient    Sherlyn Hay 04/03/2017, 5:29 PM

## 2017-04-03 NOTE — Anesthesia Postprocedure Evaluation (Addendum)
Anesthesia Post Note  Patient: [name]  Procedure(s) Performed: Procedure(s) (LRB): CESAREAN SECTION (N/A)  Patient location during evaluation: Mother Baby Anesthesia Type: Spinal Level of consciousness: awake, awake and alert, oriented and patient cooperative Pain management: pain level controlled Vital Signs Assessment: post-procedure vital signs reviewed and stable Respiratory status: spontaneous breathing, nonlabored ventilation and respiratory function stable Cardiovascular status: stable Postop Assessment: no headache, no backache, patient able to bend at knees and no signs of nausea or vomiting Anesthetic complications: no        Last Vitals:  Vitals:   04/03/17 0145 04/03/17 0508  BP: (!) 95/50 (!) 106/54  Pulse: 66 63  Resp: 18 18  Temp: 36.9 C 36.4 C    Last Pain:  Vitals:   04/03/17 0508  TempSrc: Oral  PainSc: 0-No pain   Pain Goal: Patients Stated Pain Goal: 2 (04/02/17 1553)               CARVER,ALISON L

## 2017-04-03 NOTE — Addendum Note (Signed)
Addendum  created 04/03/17 9147 by Yolonda Kida, CRNA   Sign clinical note

## 2017-04-03 NOTE — Progress Notes (Signed)
Subjective: Postpartum Day #1: Cesarean Delivery Patient reports incisional pain, tolerating PO and no problems voiding.    Objective: Vital signs in last 24 hours: Temp:  [97.5 F (36.4 C)-98.4 F (36.9 C)] 97.5 F (36.4 C) (04/05 0508) Pulse Rate:  [54-86] 63 (04/05 0508) Resp:  [12-23] 18 (04/05 0508) BP: (95-143)/(50-84) 106/54 (04/05 0508) SpO2:  [96 %-100 %] 98 % (04/05 0508) Weight:  [220 lb (99.8 kg)] 220 lb (99.8 kg) (04/04 0910)  Physical Exam:  General: alert, cooperative and no distress Lochia: appropriate Uterine Fundus: firm Incision: no significant drainage, no dehiscence, no significant erythema, pressure dressing on DVT Evaluation: No evidence of DVT seen on physical exam. No cords or calf tenderness. No significant calf/ankle edema.   Recent Labs  04/01/17 0930 04/03/17 0511  HGB 9.2* 7.5*  HCT 28.5* 23.0*    Assessment/Plan: Status post Cesarean section. Doing well postoperatively.  Continue current care.   Anemia: VSS, asymptomatic: CBC in AM, iron started.    Roe Coombs, CNM 04/03/2017, 7:12 AM

## 2017-04-03 NOTE — Progress Notes (Signed)
MOB was referred for history of depression/anxiety. * Referral screened out by Clinical Social Worker because none of the following criteria appear to apply: ~ History of anxiety/depression during this pregnancy, or of post-partum depression. ~ Diagnosis of anxiety and/or depression within last 3 years OR * MOB's symptoms currently being treated with medication and/or therapy. Please contact the Clinical Social Worker if needs arise, or if MOB requests.   

## 2017-04-04 LAB — CBC
HCT: 21.3 % — ABNORMAL LOW (ref 36.0–46.0)
Hemoglobin: 7 g/dL — ABNORMAL LOW (ref 12.0–15.0)
MCH: 27.1 pg (ref 26.0–34.0)
MCHC: 32.9 g/dL (ref 30.0–36.0)
MCV: 82.6 fL (ref 78.0–100.0)
Platelets: 280 10*3/uL (ref 150–400)
RBC: 2.58 MIL/uL — ABNORMAL LOW (ref 3.87–5.11)
RDW: 14.7 % (ref 11.5–15.5)
WBC: 14.5 10*3/uL — ABNORMAL HIGH (ref 4.0–10.5)

## 2017-04-04 MED ORDER — OXYCODONE HCL 5 MG PO TABS: 5.0000 mg | ORAL_TABLET | ORAL | 0 refills | Status: DC | PRN

## 2017-04-04 MED ORDER — IBUPROFEN 600 MG PO TABS: 600.0000 mg | ORAL_TABLET | Freq: Four times a day (QID) | ORAL | 0 refills | Status: DC | PRN

## 2017-04-04 NOTE — Discharge Summary (Signed)
OB Discharge Summary  Patient Name: [name] DOB: 1986/10/02 MRN: [id number]  Date of admission: 04/02/2017 Delivering MD: [place] Bing   Date of discharge: 04/04/2017  Admitting diagnosis: RCS [phone] Undesired Fertility [id number] Intrauterine pregnancy: [redacted]w[redacted]d     Secondary diagnosis:Active Problems:   Previous cesarean section  Additional problems: High BMI, previous c/s x 2, pre and post op anemia     Discharge diagnosis: Term Pregnancy Delivered                                                                      Complications: None  Hospital course:  Sceduled C/S   31 y.o. yo [id number] at [redacted]w[redacted]d was admitted to the hospital 04/02/2017 for scheduled cesarean section with the following indication:Elective Repeat.  Membrane Rupture Time/Date: 11:55 AM ,04/02/2017   Patient delivered a Viable infant.04/02/2017  Details of operation can be found in separate operative note.  Pateint had an uncomplicated postpartum course.  She is ambulating, tolerating a regular diet, passing flatus, and urinating well. Patient is discharged home in stable condition on  04/04/17         Physical exam  Vitals:   04/03/17 0910 04/03/17 1221 04/03/17 1849 04/04/17 0630  BP: (!) 107/51 (!) 109/59 106/60 106/65  Pulse: 61 79 77 88  Resp: Temp: 98.1 F (36.7 C) 98.3 F (36.8 C) 98 F (36.7 C) 98.1 F (36.7 C)  TempSrc: Oral Oral Oral Oral  SpO2: 97% 98%    Weight:      Height:       General: alert Lochia: appropriate Uterine Fundus: firm Incision: Dressing is clean, dry, and intact DVT Evaluation: No evidence of DVT seen on physical exam. Labs: Lab Results  Component Value Date   WBC 14.5 (H) 04/04/2017   HGB 7.0 (L) 04/04/2017   HCT 21.3 (L) 04/04/2017   MCV 82.6 04/04/2017   PLT 280 04/04/2017   CMP Latest Ref Rng & Units 03/05/2015  Glucose 70 - 99 mg/dL 98  BUN 6 - 23 mg/dL 8  Creatinine 9.56 - 2.13 mg/dL 0.86  Sodium 578 - 469 mmol/L 137  Potassium 3.5 - 5.1  mmol/L 3.7  Chloride 96 - 112 mmol/L 102  CO2 19 - 32 mmol/L 30  Calcium 8.4 - 10.5 mg/dL 8.6  Total Protein 6.0 - 8.3 g/dL 8.0  Total Bilirubin 0.3 - 1.2 mg/dL 0.5  Alkaline Phos 39 - 117 U/L 56  AST 0 - 37 U/L 19  ALT 0 - 35 U/L 16    Discharge instruction: per After Visit Summary and "Baby and Me Booklet".  After Visit Meds:  Allergies as of 04/04/2017   No Known Allergies     Medication List    STOP taking these medications   acetaminophen 500 MG tablet Commonly known as:  TYLENOL     TAKE these medications   hydrocortisone-pramoxine rectal foam Commonly known as:  PROCTOFOAM HC Place 1 applicator rectally 2 (two) times daily.   ibuprofen 600 MG tablet Commonly known as:  ADVIL,MOTRIN Take 1 tablet (600 mg total) by mouth every 6 (six) hours as needed for mild pain.   oxyCODONE 5 MG immediate release tablet Commonly known as:  Oxy IR/ROXICODONE Take 1 tablet (5 mg total) by mouth every 4 (four) hours as needed (pain scale 4-7).   prenatal multivitamin Tabs tablet Take 1 tablet by mouth daily at 12 noon.   triamcinolone ointment 0.5 % Commonly known as:  KENALOG Apply 1 application topically 2 (two) times daily.       Diet: routine diet  Activity: Advance as tolerated. Pelvic rest for 6 weeks.   Outpatient follow up:6 weeks Follow up Appt:No future appointments. Follow up visit: No Follow-up on file.  Postpartum contraception: Abstinence for now and plans Mirena at postpartum visit  Newborn Data: Live born female  Birth Weight: 8 lb 0.4 oz (3640 g) APGAR: 9, 9  Baby Feeding: breast and bottle Disposition:home with mother   04/04/2017 Allie Bossier, MD

## 2017-04-04 NOTE — Discharge Instructions (Signed)

## 2017-04-04 NOTE — Lactation Note (Signed)
This note was copied from a baby's chart. Lactation Consultation Note  Patient Name: [name] Today's Date: 04/04/2017  Mom states that her nipples were sore last night so she gave a bottle.  She has put baby back to breast this AM and now more comfortable.  She denies any questions/concerns.  Lactation outpt services and support information reviewed and encouraged prn.   Maternal Data    Feeding Feeding Type: Bottle Fed - Formula Nipple Type: Cross-cut Length of feed: 20 min  LATCH Score/Interventions Latch: Grasps breast easily, tongue down, lips flanged, rhythmical sucking.  Audible Swallowing: A few with stimulation Intervention(s): Skin to skin;Hand expression  Type of Nipple: Everted at rest and after stimulation  Comfort (Breast/Nipple): Soft / non-tender  Interventions (Mild/moderate discomfort): Hand expression  Hold (Positioning): No assistance needed to correctly position infant at breast. Intervention(s): Breastfeeding basics reviewed;Support Pillows;Position options  LATCH Score: 9  Lactation Tools Discussed/Used     Consult Status      Huston Foley 04/04/2017, 10:56 AM

## 2017-05-08 ENCOUNTER — Other Ambulatory Visit: Payer: Self-pay | Admitting: Obstetrics & Gynecology

## 2017-05-08 DIAGNOSIS — O2243 Hemorrhoids in pregnancy, third trimester: Secondary | ICD-10-CM

## 2017-05-27 ENCOUNTER — Ambulatory Visit (INDEPENDENT_AMBULATORY_CARE_PROVIDER_SITE_OTHER): Payer: Medicaid Other | Admitting: Obstetrics

## 2017-05-27 ENCOUNTER — Encounter: Payer: Self-pay | Admitting: Obstetrics

## 2017-05-27 DIAGNOSIS — Z3009 Encounter for other general counseling and advice on contraception: Secondary | ICD-10-CM

## 2017-05-27 DIAGNOSIS — Z30013 Encounter for initial prescription of injectable contraceptive: Secondary | ICD-10-CM

## 2017-05-27 MED ORDER — MEDROXYPROGESTERONE ACETATE 150 MG/ML IM SUSP: 150.0000 mg | INTRAMUSCULAR | 3 refills | Status: DC

## 2017-05-27 NOTE — Progress Notes (Signed)
   [name] is a 31 y.o. [phone] female who presents for a postpartum visit. She is 8 weeks postpartum following a low cervical transverse Cesarean section. I have fully reviewed the prenatal and intrapartum course. The delivery was at 39 gestational weeks.  Anesthesia: spinal. Postpartum course has been normal. Baby's course has been normal. Baby is feeding by bottle - Similac Advance. Bleeding no bleeding. Bowel function is normal. Bladder function is normal. Patient is not sexually active. Contraception method is abstinence. Postpartum depression screening:neg  The following portions of the patient's history were reviewed and updated as appropriate: allergies, current medications, past family history, past medical history, past social history, past surgical history and problem list.  Review of Systems A comprehensive review of systems was negative.    Objective:  unknown if currently breastfeeding.  General:  alert and no distress   Breasts:  inspection negative, no nipple discharge or bleeding, no masses or nodularity palpable  Lungs: clear to auscultation bilaterally  Heart:  regular rate and rhythm, S1, S2 normal, no murmur, click, rub or gallop  Abdomen: soft, non-tender; bowel sounds normal; no masses,  no organomegaly   Vulva:  normal  Vagina: normal vagina  Cervix:  no cervical motion tenderness  Corpus: normal size, contour, position, consistency, mobility, non-tender  Adnexa:  no mass, fullness, tenderness  Rectal Exam: Not performed.        Assessment:    Normal postpartum exam. Pap smear not done at today's visit.   Contraceptive Counseling and Advice  Plan:   1. Contraception: Depo-Provera injections 2. Depo Provera Rx 3. Follow up in: year or as needed.

## 2017-05-28 ENCOUNTER — Ambulatory Visit (INDEPENDENT_AMBULATORY_CARE_PROVIDER_SITE_OTHER): Payer: Medicaid Other

## 2017-05-28 DIAGNOSIS — Z3042 Encounter for surveillance of injectable contraceptive: Secondary | ICD-10-CM | POA: Diagnosis not present

## 2017-05-28 DIAGNOSIS — Z3202 Encounter for pregnancy test, result negative: Secondary | ICD-10-CM

## 2017-05-28 LAB — POCT URINE PREGNANCY: Preg Test, Ur: NEGATIVE

## 2017-05-28 MED ORDER — MEDROXYPROGESTERONE ACETATE 150 MG/ML IM SUSP
150.0000 mg | Freq: Once | INTRAMUSCULAR | Status: AC
  Administered 2017-05-28: 150 mg via INTRAMUSCULAR

## 2017-05-28 NOTE — Progress Notes (Signed)
Nurse visit for pt supplied Depo. UPT neg given L buttock w/o difficulty. Next Depo 8/21

## 2017-06-06 NOTE — Addendum Note (Signed)
Addendum  created 06/06/17 1137 by Corsica Franson, MD   Sign clinical note    

## 2017-08-18 ENCOUNTER — Ambulatory Visit (INDEPENDENT_AMBULATORY_CARE_PROVIDER_SITE_OTHER): Payer: Medicaid Other

## 2017-08-18 DIAGNOSIS — Z3042 Encounter for surveillance of injectable contraceptive: Secondary | ICD-10-CM | POA: Diagnosis not present

## 2017-08-18 MED ORDER — MEDROXYPROGESTERONE ACETATE 150 MG/ML IM SUSP
150.0000 mg | Freq: Once | INTRAMUSCULAR | Status: AC
  Administered 2017-08-18: 150 mg via INTRAMUSCULAR

## 2017-08-18 NOTE — Progress Notes (Signed)
Nurse visit for pt supply depo given R upper outer quad w/o difficulty. Next Depo due 11/5-11/19

## 2017-11-05 ENCOUNTER — Ambulatory Visit (INDEPENDENT_AMBULATORY_CARE_PROVIDER_SITE_OTHER): Payer: Medicaid Other

## 2017-11-05 DIAGNOSIS — Z3042 Encounter for surveillance of injectable contraceptive: Secondary | ICD-10-CM | POA: Diagnosis not present

## 2017-11-05 MED ORDER — MEDROXYPROGESTERONE ACETATE 150 MG/ML IM SUSP
150.0000 mg | Freq: Once | INTRAMUSCULAR | Status: AC
  Administered 2017-11-05: 150 mg via INTRAMUSCULAR

## 2017-11-05 NOTE — Progress Notes (Signed)
Nurse visit for pt supply Depo given R upper outer quad w/o difficulty. Pt was on time for inj. Next depo due Jan 23-Feb 6.

## 2018-01-20 ENCOUNTER — Ambulatory Visit: Payer: Medicaid Other

## 2018-01-28 ENCOUNTER — Ambulatory Visit (INDEPENDENT_AMBULATORY_CARE_PROVIDER_SITE_OTHER): Payer: Medicaid Other | Admitting: *Deleted

## 2018-01-28 VITALS — BP 122/81 | HR 64

## 2018-01-28 DIAGNOSIS — Z3042 Encounter for surveillance of injectable contraceptive: Secondary | ICD-10-CM | POA: Diagnosis not present

## 2018-01-28 MED ORDER — MEDROXYPROGESTERONE ACETATE 150 MG/ML IM SUSP
150.0000 mg | Freq: Once | INTRAMUSCULAR | Status: AC
  Administered 2018-01-28: 150 mg via INTRAMUSCULAR

## 2018-01-28 NOTE — Progress Notes (Signed)
Pt is in office for depo injection today.  Pt is on time for injection. Pt supplied depo for today's visit. Injection given, pt tolerated well.  Pt advised to RTO on 04/21/18 for next depo.  Pt will be due for AEX after next injection.  BP 122/81   Pulse 64   Administrations This Visit    medroxyPROGESTERone (DEPO-PROVERA) injection 150 mg    Admin Date 01/28/2018 Action Given Dose 150 mg Route Intramuscular Administered By Lanney GinsFoster, Alvah Gilder D, CMA

## 2018-04-20 ENCOUNTER — Telehealth: Payer: Self-pay | Admitting: *Deleted

## 2018-04-20 ENCOUNTER — Other Ambulatory Visit: Payer: Self-pay | Admitting: Obstetrics

## 2018-04-20 DIAGNOSIS — Z30013 Encounter for initial prescription of injectable contraceptive: Secondary | ICD-10-CM

## 2018-04-20 NOTE — Telephone Encounter (Signed)
Please send depo if approved for refill

## 2018-04-20 NOTE — Telephone Encounter (Signed)
Pt called in requesting her DEPO to be refilled, informed patient she is overdue for her Annual and needs to get one scheduled, please advise.Marland Kitchen..Marland Kitchen

## 2018-04-20 NOTE — Telephone Encounter (Signed)
Depo Rx 

## 2018-04-21 ENCOUNTER — Ambulatory Visit (INDEPENDENT_AMBULATORY_CARE_PROVIDER_SITE_OTHER): Payer: Medicaid Other

## 2018-04-21 DIAGNOSIS — Z3042 Encounter for surveillance of injectable contraceptive: Secondary | ICD-10-CM | POA: Diagnosis not present

## 2018-04-21 MED ORDER — MEDROXYPROGESTERONE ACETATE 150 MG/ML IM SUSP
150.0000 mg | Freq: Once | INTRAMUSCULAR | Status: AC
  Administered 2018-04-21: 150 mg via INTRAMUSCULAR

## 2018-04-21 NOTE — Progress Notes (Addendum)
Pt here for depo shot. Pt is within her window. Inj given in upper outer quad. Pt tolerated well. Next depo due 7/9-7/23.  Attestation of Attending Supervision of RN: Evaluation and management procedures were performed by the nurse under my supervision and collaboration.  I have reviewed the nursing note and chart, and I agree with the management and plan.  Carolyn L. Harraway-Smith, M.D., Evern CoreFACOG

## 2018-05-06 ENCOUNTER — Ambulatory Visit: Payer: Medicaid Other | Admitting: Obstetrics & Gynecology

## 2018-06-25 ENCOUNTER — Encounter (HOSPITAL_COMMUNITY): Payer: Self-pay | Admitting: Emergency Medicine

## 2018-06-25 ENCOUNTER — Ambulatory Visit (HOSPITAL_COMMUNITY)
Admission: EM | Admit: 2018-06-25 | Discharge: 2018-06-25 | Disposition: A | Discharge status: Home / Self Care | Payer: Medicaid Other | Attending: Family Medicine | Admitting: Family Medicine

## 2018-06-25 DIAGNOSIS — T63441A Toxic effect of venom of bees, accidental (unintentional), initial encounter: Secondary | ICD-10-CM

## 2018-06-25 NOTE — ED Triage Notes (Signed)
Pt was stung by a bee two hours x2 on the lower lip. Mild swelling.

## 2018-06-25 NOTE — Discharge Instructions (Signed)
Start taking Benadryl tonight.

## 2018-06-26 ENCOUNTER — Encounter (HOSPITAL_COMMUNITY): Payer: Self-pay | Admitting: *Deleted

## 2018-06-26 ENCOUNTER — Ambulatory Visit (HOSPITAL_COMMUNITY)
Admission: EM | Admit: 2018-06-26 | Discharge: 2018-06-26 | Disposition: A | Discharge status: Home / Self Care | Payer: Medicaid Other | Attending: Family Medicine | Admitting: Family Medicine

## 2018-06-26 DIAGNOSIS — T63441D Toxic effect of venom of bees, accidental (unintentional), subsequent encounter: Secondary | ICD-10-CM | POA: Diagnosis not present

## 2018-06-26 MED ORDER — PREDNISONE 10 MG PO TABS: 20.0000 mg | ORAL_TABLET | Freq: Every day | ORAL | 0 refills | Status: AC

## 2018-06-26 NOTE — Discharge Instructions (Addendum)
Prednisone prescribed.  Take as directed and to completion You can continue to use OTC medications for symptomatic relief of pain or itching Follow up with PCP if symptoms persists Return or go to the ED if you have any new or worsening symptoms

## 2018-06-26 NOTE — ED Triage Notes (Signed)
[facility].  States she refused the steroid, but now wishes to have it.  C/O upper lip swelling.

## 2018-06-26 NOTE — ED Provider Notes (Signed)
[facility] CARE CENTER   [id number] 06/26/18 Arrival Time: 1758  SUBJECTIVE:  [name] is a 32 y.o. female who presents with a bee sting to bottom and upper lip that occurred yesterday.  Was see by Dr. Tracie Harrier at that time and instructed to use OTC benadryl.  Requests a steroid.  Describes it as painful and red.  Has tried OTC benadryl without relief.  Reports previous bee sting in the past. Denies fever, chills, nausea, vomiting, oral paresthesias, oral swelling, tongue paresthesias, tongue swelling, throat tightness, throat swelling, SOB, chest pain, abdominal pain, changes in bowel or bladder function.     ROS: As per HPI.  Past Medical History:  Diagnosis Date  . CIN III (cervical intraepithelial neoplasia III)   . History of kidney stones   . Vaginal Pap smear, abnormal   . Wears glasses    Past Surgical History:  Procedure Laterality Date  . CESAREAN SECTION  12-23-2002  . CESAREAN SECTION N/A 04/07/2014   Procedure: CESAREAN SECTION;  Surgeon: Michael Litter, MD;  Location: [facility];  Service: Obstetrics;  Laterality: N/A;  . CESAREAN SECTION N/A 04/02/2017   Procedure: CESAREAN SECTION;  Surgeon: [place] Bing, MD;  Location: [facility] BIRTHING SUITES;  Service: Obstetrics;  Laterality: N/A;  . COLPOSCOPY    . LEEP N/A 09/14/2014   Procedure: LOOP ELECTROSURGICAL EXCISION PROCEDURE (LEEP);  Surgeon: Bernita Buffy. Duard Brady, MD;  Location: [facility];  Service: Gynecology;  Laterality: N/A;  . WISDOM TOOTH EXTRACTION  2012   No Known Allergies Current Facility-Administered Medications on File Prior to Encounter  Medication Dose Route Frequency Provider Last Rate Last Dose  . ceFAZolin (ANCEF) 2 g in dextrose 5 % 100 mL IVPB  2 g Intravenous 30 min Pre-Op [place] Bing, MD      . sodium citrate-citric acid (ORACIT) solution 30 mL  30 mL Oral 30 min Pre-Op  Bing, MD       Current Outpatient Medications on File Prior to Encounter  Medication Sig Dispense  Refill  . medroxyPROGESTERone Acetate 150 MG/ML SUSY INJECT 1 MILLILITER INTO THE MUSCLE EVERY 3 MONTHS 1 Syringe 2  . triamcinolone ointment (KENALOG) 0.5 % Apply 1 application topically 2 (two) times daily. (Patient not taking: Reported on 02/12/2017) 90 g PRN   Social History   Socioeconomic History  . Marital status: Single    Spouse name: Not on file  . Number of children: Not on file  . Years of education: Not on file  . Highest education level: Not on file  Occupational History  . Not on file  Social Needs  . Financial resource strain: Not on file  . Food insecurity:    Worry: Not on file    Inability: Not on file  . Transportation needs:    Medical: Not on file    Non-medical: Not on file  Tobacco Use  . Smoking status: Never Smoker  . Smokeless tobacco: Never Used  Substance and Sexual Activity  . Alcohol use: No    Comment: rarely  . Drug use: No  . Sexual activity: Yes    Birth control/protection: None  Lifestyle  . Physical activity:    Days per week: Not on file    Minutes per session: Not on file  . Stress: Not on file  Relationships  . Social connections:    Talks on phone: Not on file    Gets together: Not on file    Attends religious service: Not on file  Active member of club or organization: Not on file    Attends meetings of clubs or organizations: Not on file    Relationship status: Not on file  . Intimate partner violence:    Fear of current or ex partner: Not on file    Emotionally abused: Not on file    Physically abused: Not on file    Forced sexual activity: Not on file  Other Topics Concern  . Not on file  Social History Narrative   ** Merged History Encounter **       Family History  Problem Relation Age of Onset  . Healthy Mother   . Healthy Father   . Anesthesia problems Neg Hx   . Alcohol abuse Neg Hx     OBJECTIVE: Vitals:   06/26/18 1808  BP: 122/80  Pulse: 63  Resp: 16  Temp: 98.6 F (37 C)  SpO2: 100%      General appearance: alert; no distress Lungs: clear to auscultation bilaterally Heart: regular rate and rhythm.  Radial pulse 2+ bilaterally Extremities: no edema Skin: warm and dry; no obvious pustule, mild erythema and swelling about the right top and lower lip Psychological: alert and cooperative; normal mood and affect  ASSESSMENT & PLAN:  1. Bee sting, accidental or unintentional, subsequent encounter     Meds ordered this encounter  Medications  . predniSONE (DELTASONE) 10 MG tablet    Sig: Take 2 tablets (20 mg total) by mouth daily for 5 days.    Dispense:  10 tablet    Refill:  0    Order Specific Question:   Supervising Provider    Answer:   Isa RankinMURRAY, LAURA WILSON [[id number]]   Prednisone prescribed.  Take as directed and to completion You can continue to use OTC medications for symptomatic relief of pain or itching Follow up with PCP if symptoms persists Return or go to the ED if you have any new or worsening symptoms   Reviewed expectations re: course of current medical issues. Questions answered. Outlined signs and symptoms indicating need for more acute intervention. Patient verbalized understanding. After Visit Summary given.   Rennis HardingWurst, Homer Pfeifer, PA-C 06/26/18 1857

## 2018-07-08 NOTE — ED Provider Notes (Signed)
[facility]   [id number] 06/25/18 Arrival Time: 1931  ASSESSMENT & PLAN:  1. Bee sting, accidental or unintentional, initial encounter    Benadryl and ice tonight. F/U here if worsening. Reviewed expectations re: course of current medical issues. Questions answered. Outlined signs and symptoms indicating need for more acute intervention. Patient verbalized understanding. After Visit Summary given.   SUBJECTIVE: History from: patient. [name] is a 32 y.o. female who reports a bee sting.  Location: lower lip swelling after getting stung here by a bee Onset: abrupt Duration: a few hours Pruritic? No Painful? mildly Progression: stable  No swallowing or respiratory difficulties reported. No OTC/home treatment. No n/v.   ROS: As per HPI.   OBJECTIVE:  Vitals:   06/25/18 1944 06/25/18 1948  BP:  124/70  Pulse: 87   Resp: 18   Temp: 98.4 F (36.9 C)   SpO2: 100%     General appearance: alert; no distress HENT: normocephalic; atraumatic Neck: supple  Lungs: clear to auscultation bilaterally Heart: regular rate and rhythm Extremities: no edema; symmetrical with no gross deformities Skin: warm and dry; lower lip with mild swelling; no stinger found Psychological: alert and cooperative; normal mood and affect  No Known Allergies  Past Medical History:  Diagnosis Date  . CIN III (cervical intraepithelial neoplasia III)   . History of kidney stones   . Vaginal Pap smear, abnormal   . Wears glasses    Social History   Socioeconomic History  . Marital status: Single    Spouse name: Not on file  . Number of children: Not on file  . Years of education: Not on file  . Highest education level: Not on file  Occupational History  . Not on file  Social Needs  . Financial resource strain: Not on file  . Food insecurity:    Worry: Not on file    Inability: Not on file  . Transportation needs:    Medical: Not on file    Non-medical: Not on  file  Tobacco Use  . Smoking status: Never Smoker  . Smokeless tobacco: Never Used  Substance and Sexual Activity  . Alcohol use: No    Comment: rarely  . Drug use: No  . Sexual activity: Yes    Birth control/protection: None  Lifestyle  . Physical activity:    Days per week: Not on file    Minutes per session: Not on file  . Stress: Not on file  Relationships  . Social connections:    Talks on phone: Not on file    Gets together: Not on file    Attends religious service: Not on file    Active member of club or organization: Not on file    Attends meetings of clubs or organizations: Not on file    Relationship status: Not on file  . Intimate partner violence:    Fear of current or ex partner: Not on file    Emotionally abused: Not on file    Physically abused: Not on file    Forced sexual activity: Not on file  Other Topics Concern  . Not on file  Social History Narrative   ** Merged History Encounter **       Family History  Problem Relation Age of Onset  . Healthy Mother   . Healthy Father   . Anesthesia problems Neg Hx   . Alcohol abuse Neg Hx    Past Surgical History:  Procedure Laterality Date  . CESAREAN  SECTION  12-23-2002  . CESAREAN SECTION N/A 04/07/2014   Procedure: CESAREAN SECTION;  Surgeon: Michael Litter, MD;  Location: [facility];  Service: Obstetrics;  Laterality: N/A;  . CESAREAN SECTION N/A 04/02/2017   Procedure: CESAREAN SECTION;  Surgeon: [place] Bing, MD;  Location: [facility]THING SUIT[facility];  Service: Obstetrics;  Laterality: N/A;  . COLPOSCOPY    . LEEP N/A 09/14/2014   Procedure: LOOP ELECTROSURGICAL EXCISION PROCEDURE (LEEP);  Surgeon: Bernita Buffy. Duard Brady, MD;  Location: [facility];  Service: Gynecology;  Laterality: N/A;  . WISDOM TOOTH EXTRACTION  2012     Mardella Layman, MD 07/08/18 1028

## 2018-07-13 ENCOUNTER — Ambulatory Visit: Payer: Medicaid Other

## 2018-07-20 ENCOUNTER — Ambulatory Visit: Payer: Medicaid Other

## 2019-12-28 ENCOUNTER — Ambulatory Visit: Payer: BLUE CROSS/BLUE SHIELD | Attending: Internal Medicine

## 2019-12-28 DIAGNOSIS — Z20822 Contact with and (suspected) exposure to covid-19: Secondary | ICD-10-CM

## 2019-12-30 LAB — NOVEL CORONAVIRUS, NAA: SARS-CoV-2, NAA: NOT DETECTED

## 2020-07-22 ENCOUNTER — Ambulatory Visit: Payer: Self-pay | Attending: Family

## 2020-07-22 DIAGNOSIS — Z23 Encounter for immunization: Secondary | ICD-10-CM

## 2020-07-22 NOTE — Progress Notes (Signed)
   Covid-19 Vaccination Clinic  Name:  [name]    MRN: [id number] DOB: December 04, 1986  07/22/2020  [name] was observed post Covid-19 immunization for 15 minutes without incident. She was provided with Vaccine Information Sheet and instruction to access the V-Safe system.   [name] was instructed to call 911 with any severe reactions post vaccine: Marland Kitchen Difficulty breathing  . Swelling of face and throat  . A fast heartbeat  . A bad rash all over body  . Dizziness and weakness   Immunizations Administered    Name Date Dose VIS Date Route   Pfizer COVID-19 Vaccine 07/22/2020  6:52 PM 0.3 mL 02/23/2019 Intramuscular   Manufacturer: [employer], [employer]   Lot: [id number]   NDC: [address]-0

## 2020-08-15 ENCOUNTER — Ambulatory Visit: Payer: Self-pay

## 2020-08-22 ENCOUNTER — Ambulatory Visit: Payer: Self-pay | Attending: Internal Medicine

## 2020-08-22 DIAGNOSIS — Z23 Encounter for immunization: Secondary | ICD-10-CM

## 2020-08-22 NOTE — Progress Notes (Signed)
   Covid-19 Vaccination Clinic  Name:  [name]    MRN: [id number] DOB: [birth year]  08/22/2020  [name] was observed post Covid-19 immunization for 15 minutes  without incident. She was provided with Vaccine Information Sheet and instruction to access the V-Safe system.   [name] was instructed to call 911 with any severe reactions post vaccine: Marland Kitchen Difficulty breathing  . Swelling of face and throat  . A fast heartbeat  . A bad rash all over body  . Dizziness and weakness   Immunizations Administered    Name Date Dose VIS Date Route   Pfizer COVID-19 Vaccine 08/22/2020  1:09 PM 0.3 mL 02/23/2019 Intramuscular   Manufacturer: [employer], [employer]   Lot: [id number]   NDC: [address]-8      Covid-19 Vaccination Clinic  Name:  [name]    MRN: [id number] DOB: [birth year]  08/22/2020  [name] was observed post Covid-19 immunization for 15 minutes without incident. She was provided with Vaccine Information Sheet and instruction to access the V-Safe system.   [name] was instructed to call 911 with any severe reactions post vaccine: Marland Kitchen Difficulty breathing  . Swelling of face and throat  . A fast heartbeat  . A bad rash all over body  . Dizziness and weakness   Immunizations Administered    Name Date Dose VIS Date Route   Pfizer COVID-19 Vaccine 08/22/2020  1:09 PM 0.3 mL 02/23/2019 Intramuscular   Manufacturer: [employer], [employer]   Lot: [id number]   NDC: [address]-4

## 2020-08-26 ENCOUNTER — Encounter (HOSPITAL_COMMUNITY): Payer: Self-pay | Admitting: Emergency Medicine

## 2020-08-26 ENCOUNTER — Emergency Department (HOSPITAL_COMMUNITY)
Admission: EM | Admit: 2020-08-26 | Discharge: 2020-08-26 | Disposition: A | Discharge status: Home / Self Care | Payer: Self-pay | Attending: Emergency Medicine | Admitting: Emergency Medicine

## 2020-08-26 ENCOUNTER — Other Ambulatory Visit: Payer: Self-pay

## 2020-08-26 ENCOUNTER — Emergency Department (HOSPITAL_COMMUNITY): Payer: Self-pay

## 2020-08-26 DIAGNOSIS — R072 Precordial pain: Secondary | ICD-10-CM | POA: Insufficient documentation

## 2020-08-26 LAB — CBC
HCT: 37.2 % (ref 36.0–46.0)
Hemoglobin: 11.8 g/dL — ABNORMAL LOW (ref 12.0–15.0)
MCH: 29.4 pg (ref 26.0–34.0)
MCHC: 31.7 g/dL (ref 30.0–36.0)
MCV: 92.5 fL (ref 80.0–100.0)
Platelets: 335 10*3/uL (ref 150–400)
RBC: 4.02 MIL/uL (ref 3.87–5.11)
RDW: 13.2 % (ref 11.5–15.5)
WBC: 10 10*3/uL (ref 4.0–10.5)
nRBC: 0 % (ref 0.0–0.2)

## 2020-08-26 LAB — TROPONIN I (HIGH SENSITIVITY): Troponin I (High Sensitivity): <2 ng/L (ref <18)

## 2020-08-26 LAB — BASIC METABOLIC PANEL
Anion gap: 9 (ref 5–15)
BUN: 13 mg/dL (ref 6–20)
CO2: 28 mmol/L (ref 22–32)
Calcium: 9.3 mg/dL (ref 8.9–10.3)
Chloride: 101 mmol/L (ref 98–111)
Creatinine, Ser: 0.81 mg/dL (ref 0.44–1.00)
GFR calc Af Amer: >60 mL/min (ref >60)
GFR calc non Af Amer: >60 mL/min (ref >60)
Glucose, Bld: 99 mg/dL (ref 70–99)
Potassium: 4.1 mmol/L (ref 3.5–5.1)
Sodium: 138 mmol/L (ref 135–145)

## 2020-08-26 LAB — I-STAT BETA HCG BLOOD, ED (NOT ORDERABLE): I-stat hCG, quantitative: <5 m[IU]/mL (ref <5)

## 2020-08-26 MED ORDER — IBUPROFEN 800 MG PO TABS: 800.0000 mg | ORAL_TABLET | Freq: Three times a day (TID) | ORAL | 0 refills | Status: DC

## 2020-08-26 MED ORDER — IBUPROFEN 800 MG PO TABS
800.0000 mg | ORAL_TABLET | Freq: Once | ORAL | Status: AC
  Administered 2020-08-26: 800 mg via ORAL
  Filled 2020-08-26: qty 1

## 2020-08-26 MED ORDER — ACETAMINOPHEN 500 MG PO TABS
1000.0000 mg | ORAL_TABLET | Freq: Once | ORAL | Status: AC
  Administered 2020-08-26: 1000 mg via ORAL
  Filled 2020-08-26: qty 2

## 2020-08-26 NOTE — ED Triage Notes (Signed)
Patient arrives after having left upper chest and shoulder pain for a month. Patient states that it hurts worse with movement. Patient denies any other symptoms. States nothing makes it better.

## 2020-08-26 NOTE — ED Notes (Signed)
Patient states that she is feeling better.

## 2020-08-26 NOTE — ED Provider Notes (Signed)
[facility]-EMERGENCY DEPT Provider Note   CSN: [id number] Arrival date & time: 08/26/20  0124     History Chief Complaint  Patient presents with  . Chest Pain  . Shoulder Pain    [name] is a 34 y.o. female.  The history is provided by the patient.  Chest Pain Chest pain location: left upper chest pain at the clavicle  Pain quality: sharp   Pain radiates to:  Does not radiate Pain severity:  Moderate Onset quality:  Gradual Duration:  1 month Timing:  Constant Progression:  Unchanged Chronicity:  New Context: lifting   Context: not breathing   Context comment:  Laying on that side and moving Relieved by:  Nothing Worsened by:  Certain positions and movement (laying on that side) Ineffective treatments:  None tried Associated symptoms: no abdominal pain, no AICD problem, no altered mental status, no anorexia, no anxiety, no claudication, no cough, no diaphoresis, no dizziness, no dysphagia, no headache, no heartburn, no lower extremity edema, no nausea, no near-syncope, no numbness, no orthopnea, no palpitations, no PND, no shortness of breath, no syncope, no vomiting and no weakness   Risk factors: no aortic disease, not pregnant and no prior DVT/PE   No travel no leg pain no OCPs.       Past Medical History:  Diagnosis Date  . CIN III (cervical intraepithelial neoplasia III)   . History of kidney stones   . Vaginal Pap smear, abnormal   . Wears glasses     Patient Active Problem List   Diagnosis Date Noted  . Previous cesarean section 04/02/2017  . GBS (group B Streptococcus carrier), +RV culture, currently pregnant 03/20/2017  . Hemorrhoids during pregnancy, antepartum, third trimester 03/12/2017  . Contraception management 02/25/2017  . Previous cesarean section complicating pregnancy, antepartum condition or complication 09/25/2016  . Supervision of other normal pregnancy, antepartum 09/25/2016  . Anxiety state, unspecified  04/06/2014  . 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  Needs colpo pp. 12/07/2013    Past Surgical History:  Procedure Laterality Date  . CESAREAN SECTION  12-23-2002  . CESAREAN SECTION N/A 04/07/2014   Procedure: CESAREAN SECTION;  Surgeon: Michael LitterNaima A Dillard, MD;  Location: [facility];  Service: Obstetrics;  Laterality: N/A;  . CESAREAN SECTION N/A 04/02/2017   Procedure: CESAREAN SECTION;  Surgeon: [place] Bingharlie Pickens, MD;  [facility];  Service: Obstetrics;  Laterality: N/A;  . COLPOSCOPY    . LEEP N/A 09/14/2014   Procedure: LOOP ELECTROSURGICAL EXCISION PROCEDURE (LEEP);  Surgeon: Bernita BuffyPaola A. Duard BradyGehrig, MD;  [facility];  Service: Gynecology;  Laterality: N/A;  . WISDOM TOOTH EXTRACTION  2012     OB History    Gravida  4   Para  3   Term  3   Preterm      AB  1   Living  3     SAB  1   TAB      Ectopic      Multiple  0   Live Births  3           Family History  Problem Relation Age of Onset  . Healthy Mother   . Healthy Father   . Anesthesia problems Neg Hx   . Alcohol abuse Neg Hx     Social History   Tobacco Use  . Smoking status: Never Smoker  . Smokeless tobacco: Never Used  Substance Use Topics  . Alcohol use: No  Comment: rarely  . Drug use: No    Home Medications Prior to Admission medications   Medication Sig Start Date End Date Taking? Authorizing Provider  medroxyPROGESTERone Acetate 150 MG/ML SUSY INJECT 1 MILLILITER INTO THE MUSCLE EVERY 3 MONTHS 04/20/18   Brock Bad, MD  triamcinolone ointment (KENALOG) 0.5 % Apply 1 application topically 2 (two) times daily. Patient not taking: Reported on 02/12/2017 11/05/16   Roe Coombs, CNM    Allergies    Patient has no known allergies.  Review of Systems   Review of Systems  Constitutional: Negative for diaphoresis.  HENT: Negative for trouble swallowing.   Eyes: Negative for visual disturbance.  Respiratory: Negative for cough and shortness of  breath.   Cardiovascular: Positive for chest pain. Negative for palpitations, orthopnea, claudication, syncope, PND and near-syncope.  Gastrointestinal: Negative for abdominal pain, anorexia, heartburn, nausea and vomiting.  Genitourinary: Negative for difficulty urinating.  Musculoskeletal: Negative for arthralgias.  Skin: Negative for rash.  Neurological: Negative for dizziness, weakness, numbness and headaches.  Psychiatric/Behavioral: Negative for agitation.  All other systems reviewed and are negative.   Physical Exam Updated Vital Signs BP 109/68 (BP Location: Left Arm)   Pulse 78   Temp 98.2 F (36.8 C) (Oral)   Resp (!) 21   LMP 08/02/2020   SpO2 96%   Physical Exam Vitals and nursing note reviewed.  Constitutional:      General: She is not in acute distress.    Appearance: Normal appearance.  HENT:     Head: Normocephalic and atraumatic.     Nose: Nose normal.  Eyes:     Conjunctiva/sclera: Conjunctivae normal.     Pupils: Pupils are equal, round, and reactive to light.  Cardiovascular:     Rate and Rhythm: Normal rate and regular rhythm.     Pulses: Normal pulses.     Heart sounds: Normal heart sounds.  Pulmonary:     Effort: Pulmonary effort is normal.     Breath sounds: Normal breath sounds.  Abdominal:     General: Abdomen is flat. Bowel sounds are normal.     Palpations: Abdomen is soft.     Tenderness: There is no abdominal tenderness. There is no guarding or rebound.  Musculoskeletal:        General: Normal range of motion.     Cervical back: Normal range of motion and neck supple.  Skin:    General: Skin is warm and dry.     Capillary Refill: Capillary refill takes less than 2 seconds.  Neurological:     General: No focal deficit present.     Mental Status: She is alert and oriented to person, place, and time.     Deep Tendon Reflexes: Reflexes normal.  Psychiatric:        Mood and Affect: Mood normal.        Behavior: Behavior normal.      ED Results / Procedures / Treatments   Labs (all labs ordered are listed, but only abnormal results are displayed) Results for orders placed or performed during the hospital encounter of 08/26/20  Basic metabolic panel  Result Value Ref Range   Sodium 138 135 - 145 mmol/L   Potassium 4.1 3.5 - 5.1 mmol/L   Chloride 101 98 - 111 mmol/L   CO2 28 22 - 32 mmol/L   Glucose, Bld 99 70 - 99 mg/dL   BUN 13 6 - 20 mg/dL   Creatinine, Ser 2.35 0.44 - 1.00 mg/dL  Calcium 9.3 8.9 - 10.3 mg/dL   GFR calc non Af Amer >60 >60 mL/min   GFR calc Af Amer >60 >60 mL/min   Anion gap 9 5 - 15  CBC  Result Value Ref Range   WBC 10.0 4.0 - 10.5 K/uL   RBC 4.02 3.87 - 5.11 MIL/uL   Hemoglobin 11.8 (L) 12.0 - 15.0 g/dL   HCT 62.7 36 - 46 %   MCV 92.5 80.0 - 100.0 fL   MCH 29.4 26.0 - 34.0 pg   MCHC 31.7 30.0 - 36.0 g/dL   RDW 03.5 00.9 - 38.1 %   Platelets 335 150 - 400 K/uL   nRBC 0.0 0.0 - 0.2 %  I-Stat beta hCG blood, ED  Result Value Ref Range   I-stat hCG, quantitative <5.0 <5 mIU/mL   Comment 3          Troponin I (High Sensitivity)  Result Value Ref Range   Troponin I (High Sensitivity) <2 <18 ng/L   DG Chest 2 View  Result Date: 08/26/2020 CLINICAL DATA:  Initial evaluation for acute chest pain. EXAM: CHEST - 2 VIEW COMPARISON:  Prior radiograph from 02/25/2005. FINDINGS: Cardiac and mediastinal silhouettes are within normal limits. Lungs normally inflated. Irregular linear densities at the left lung base most consistent with atelectasis and/or scarring. No focal infiltrates. No edema or effusion. No pneumothorax. No acute osseous finding. IMPRESSION: 1. Mild left basilar atelectasis and/or scarring. 2. No other active cardiopulmonary disease. Electronically Signed   By: Rise Mu M.D.   On: 08/26/2020 02:08    EKG EKG Interpretation  Date/Time:  Saturday August 26 2020 04:51:49 EDT Ventricular Rate:  67 PR Interval:    QRS Duration: 83 QT Interval:  430 QTC  Calculation: 454 R Axis:   77 Text Interpretation: Sinus rhythm Confirmed by Etheridge Geil ([id number]) on 08/26/2020 4:54:47 AM   Radiology DG Chest 2 View  Result Date: 08/26/2020 CLINICAL DATA:  Initial evaluation for acute chest pain. EXAM: CHEST - 2 VIEW COMPARISON:  Prior radiograph from 02/25/2005. FINDINGS: Cardiac and mediastinal silhouettes are within normal limits. Lungs normally inflated. Irregular linear densities at the left lung base most consistent with atelectasis and/or scarring. No focal infiltrates. No edema or effusion. No pneumothorax. No acute osseous finding. IMPRESSION: 1. Mild left basilar atelectasis and/or scarring. 2. No other active cardiopulmonary disease. Electronically Signed   By: Rise Mu M.D.   On: 08/26/2020 02:08    Procedures Procedures (including critical care time)  Medications Ordered in ED Medications  ibuprofen (ADVIL) tablet 800 mg (800 mg Oral Given 08/26/20 0515)  acetaminophen (TYLENOL) tablet 1,000 mg (1,000 mg Oral Given 08/26/20 0515)    ED Course  I have reviewed the triage vital signs and the nursing notes.  Pertinent labs & imaging results that were available during my care of the patient were reviewed by me and considered in my medical decision making (see chart for details).    Given the time course, the patient has ruled out for MI with a normal ekg and one normal troponin.  HEART score is 1 very low risk for MACE.  PERC negative wells 0 highly doubt PE in this low risk patient.    KAMARRI FISCHETTI was evaluated in Emergency Department on 08/26/2020 for the symptoms described in the history of present illness. She was evaluated in the context of the global COVID-19 pandemic, which necessitated consideration that the patient might be at risk for infection with the SARS-CoV-2 virus  that causes COVID-19. Institutional protocols and algorithms that pertain to the evaluation of patients at risk for COVID-19 are in a state of rapid  change based on information released by regulatory bodies including the CDC and federal and state organizations. These policies and algorithms were followed during the patient's care in the ED.  Final Clinical Impression(s) / ED Diagnoses Return for intractable cough, coughing up blood,fevers >100.4 unrelieved by medication, shortness of breath, intractable vomiting, chest pain, shortness of breath, weakness,numbness, changes in speech, facial asymmetry,abdominal pain, passing out,Inability to tolerate liquids or food, cough, altered mental status or any concerns. No signs of systemic illness or infection. The patient is nontoxic-appearing on exam and vital signs are within normal limits.   I have reviewed the triage vital signs and the nursing notes. Pertinent labs &imaging results that were available during my care of the patient were reviewed by me and considered in my medical decision making (see chart for details).After history, exam, and medical workup I feel the patient has beenappropriately medically screened and is safe for discharge home. Pertinent diagnoses were discussed with the patient. Patient was given return precautions.   Pammy Vesey, MD 08/26/20 [phone]

## 2021-11-26 ENCOUNTER — Emergency Department (HOSPITAL_BASED_OUTPATIENT_CLINIC_OR_DEPARTMENT_OTHER)
Admission: EM | Admit: 2021-11-26 | Discharge: 2021-11-26 | Disposition: A | Discharge status: Home / Self Care | Payer: BLUE CROSS/BLUE SHIELD | Attending: Emergency Medicine | Admitting: Emergency Medicine

## 2021-11-26 ENCOUNTER — Emergency Department (HOSPITAL_BASED_OUTPATIENT_CLINIC_OR_DEPARTMENT_OTHER): Payer: BLUE CROSS/BLUE SHIELD | Admitting: Radiology

## 2021-11-26 ENCOUNTER — Encounter (HOSPITAL_BASED_OUTPATIENT_CLINIC_OR_DEPARTMENT_OTHER): Payer: Self-pay | Admitting: Emergency Medicine

## 2021-11-26 ENCOUNTER — Other Ambulatory Visit: Payer: Self-pay

## 2021-11-26 DIAGNOSIS — M545 Low back pain, unspecified: Secondary | ICD-10-CM | POA: Insufficient documentation

## 2021-11-26 DIAGNOSIS — M533 Sacrococcygeal disorders, not elsewhere classified: Secondary | ICD-10-CM | POA: Diagnosis not present

## 2021-11-26 LAB — PREGNANCY, URINE: Preg Test, Ur: NEGATIVE

## 2021-11-26 MED ORDER — METHYLPREDNISOLONE 4 MG PO TBPK: ORAL_TABLET | ORAL | 0 refills | Status: DC

## 2021-11-26 MED ORDER — CELECOXIB 200 MG PO CAPS: 200.0000 mg | ORAL_CAPSULE | Freq: Two times a day (BID) | ORAL | 0 refills | Status: DC

## 2021-11-26 MED ORDER — DEXAMETHASONE SODIUM PHOSPHATE 10 MG/ML IJ SOLN
10.0000 mg | Freq: Once | INTRAMUSCULAR | Status: AC
  Administered 2021-11-26: 18:00:00 10 mg via INTRAMUSCULAR
  Filled 2021-11-26: qty 1

## 2021-11-26 MED ORDER — KETOROLAC TROMETHAMINE 60 MG/2ML IM SOLN
60.0000 mg | Freq: Once | INTRAMUSCULAR | Status: AC
  Administered 2021-11-26: 18:00:00 60 mg via INTRAMUSCULAR
  Filled 2021-11-26: qty 2

## 2021-11-26 NOTE — Discharge Instructions (Addendum)
Your x-rays were negative.  I suspect that you are having tailbone pain due to prolonged sitting.  This can cause a tailbone injury causing her pain.  I am discharging you with an anti-inflammatory medication and a steroid to reduce inflammation in the area.  Please follow the directions in the handout for further pain management.  If you are not improving follow-up with your primary care physician.

## 2021-11-26 NOTE — ED Provider Notes (Signed)
[facility] EMERGENCY DEPT Provider Note   CSN: [id number] Arrival date & time: 11/26/21  1335     History Chief Complaint  Patient presents with   Back Pain    [name] is a 35 y.o. female who presents emergency department with chief complaint of back pain.  Patient states that she had onset of some back pain on Friday as her menstrual pain started.  She states that Saturday she drove home from a long car ride visiting family and states that when she got home she noticed that she was very achy in her lower back.  She rode with her seat warmer on.  She states that yesterday she got up and had severe pain in her sacral region.  Since that time she continues to have pain which is worse with movement, better with rest.  She denies any urinary symptoms states that she has had a UTI in the past that this feels nothing like it.  She also complains of a sensation of heaviness in her legs but denies any bilateral leg swelling, chest pain, shortness of breath.  Patient denies any known injuries.   Back Pain Associated symptoms: no dysuria and no fever       Past Medical History:  Diagnosis Date   CIN III (cervical intraepithelial neoplasia III)    History of kidney stones    Vaginal Pap smear, abnormal    Wears glasses     Patient Active Problem List   Diagnosis Date Noted   Previous cesarean section 04/02/2017   GBS (group B Streptococcus carrier), +RV culture, currently pregnant 03/20/2017   Hemorrhoids during pregnancy, antepartum, third trimester 03/12/2017   Contraception management 02/25/2017   Previous cesarean section complicating pregnancy, antepartum condition or complication 09/25/2016   Supervision of other normal pregnancy, antepartum 09/25/2016   Anxiety state, unspecified 04/06/2014   Dysplasia of cervix--HGSIL, 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  Needs colpo pp. 12/07/2013    Past Surgical History:  Procedure Laterality Date   CESAREAN SECTION  12-23-2002   CESAREAN  SECTION N/A 04/07/2014   Procedure: CESAREAN SECTION;  Surgeon: Michael Litter, MD;  Location: [facility];  Service: Obstetrics;  Laterality: N/A;   CESAREAN SECTION N/A 04/02/2017   Procedure: CESAREAN SECTION;  Surgeon: [place] Bing, MD;  Location: [facility]IRTHING SUIT[facility];  Service: Obstetrics;  Laterality: N/A;   COLPOSCOPY     LEEP N/A 09/14/2014   Procedure: LOOP ELECTROSURGICAL EXCISION PROCEDURE (LEEP);  Surgeon: Bernita Buffy. Duard Brady, MD;  Location: [facility];  Service: Gynecology;  Laterality: N/A;   WISDOM TOOTH EXTRACTION  2012     OB History     Gravida  4   Para  3   Term  3   Preterm      AB  1   Living  3      SAB  1   IAB      Ectopic      Multiple  0   Live Births  3           Family History  Problem Relation Age of Onset   Healthy Mother    Healthy Father    Anesthesia problems Neg Hx    Alcohol abuse Neg Hx     Social History   Tobacco Use   Smoking status: Never   Smokeless tobacco: Never  Substance Use Topics   Alcohol use: No    Comment: rarely   Drug use: No    Home Medications Prior  to Admission medications   Medication Sig Start Date End Date Taking? Authorizing Provider  ibuprofen (ADVIL) 800 MG tablet Take 1 tablet (800 mg total) by mouth 3 (three) times daily. 08/26/20   Palumbo, April, MD  medroxyPROGESTERone Acetate 150 MG/ML SUSY INJECT 1 MILLILITER INTO THE MUSCLE EVERY 3 MONTHS 04/20/18   Brock Bad, MD  triamcinolone ointment (KENALOG) 0.5 % Apply 1 application topically 2 (two) times daily. Patient not taking: Reported on 02/12/2017 11/05/16   Roe Coombs, CNM    Allergies    Patient has no known allergies.  Review of Systems   Review of Systems  Constitutional:  Negative for chills and fever.  Gastrointestinal:  Negative for nausea.  Genitourinary:  Negative for dysuria and flank pain.  Musculoskeletal:  Positive for back pain.  Skin:  Negative for rash and wound.   Physical Exam Updated  Vital Signs BP 113/60   Pulse 60   Temp 98.1 F (36.7 C)   Resp 16   Ht 5\' 5"  (1.651 m)   Wt 102.1 kg   LMP 11/22/2021   SpO2 100%   BMI 37.44 kg/m   Physical Exam Vitals and nursing note reviewed.  Constitutional:      General: She is not in acute distress.    Appearance: She is well-developed. She is not diaphoretic.  HENT:     Head: Normocephalic and atraumatic.     Right Ear: External ear normal.     Left Ear: External ear normal.     Nose: Nose normal.     Mouth/Throat:     Mouth: Mucous membranes are moist.  Eyes:     General: No scleral icterus.    Conjunctiva/sclera: Conjunctivae normal.  Cardiovascular:     Rate and Rhythm: Normal rate and regular rhythm.     Heart sounds: Normal heart sounds. No murmur heard.   No friction rub. No gallop.  Pulmonary:     Effort: Pulmonary effort is normal. No respiratory distress.     Breath sounds: Normal breath sounds.  Abdominal:     General: Bowel sounds are normal. There is no distension.     Palpations: Abdomen is soft. There is no mass.     Tenderness: There is no abdominal tenderness. There is no guarding.  Musculoskeletal:     Cervical back: Normal range of motion.     Comments: No midline spinal tenderness, no of lumbar paraspinal tenderness, tenderness to palpation over the sacrum and coccyx.  Pain with forward flexion and change in position, no pain with lateral flexion or twisting.  No swelling of the lower extremities, normal strength bilaterally with dorsi and plantar flexion, normal sensation and 2+ DP/PT pulses.  Skin:    General: Skin is warm and dry.  Neurological:     Mental Status: She is alert and oriented to person, place, and time.  Psychiatric:        Behavior: Behavior normal.    ED Results / Procedures / Treatments   Labs (all labs ordered are listed, but only abnormal results are displayed) Labs Reviewed - No data to display  EKG None  Radiology No results  found.  Procedures Procedures   Medications Ordered in ED Medications - No data to display  ED Course  I have reviewed the triage vital signs and the nursing notes.  Pertinent labs & imaging results that were available during my care of the patient were reviewed by me and considered in my medical decision making (see  chart for details).    MDM Rules/Calculators/A&P 35 year old female who presents with tailbone pain.  Patient was in a car ride for long prolonged period and I suspect she has tailbone injury due to pressure.  No evidence of fracture on plain films of the sacrum or lumbar spine.  I doubt sacroiliitis as patient has no history of inflammatory or autoimmune diseases.  Patient will be discharged with Celebrex and Decadron.  Given Toradol Decadron here.  She appears otherwise appropriate for discharge at this time.  Discussed outpatient follow-up and return precautions. Final Clinical Impression(s) / ED Diagnoses Final diagnoses:  None    Rx / DC Orders ED Discharge Orders     None        Arthor Captain, PA-C 11/26/21 1902    Tegeler, Canary Brim, MD 11/26/21 [phone]

## 2021-11-26 NOTE — ED Triage Notes (Signed)
Low back pain since yesterday. States her legs feel heavy. No known injury. Hx of back problems.

## 2021-11-29 ENCOUNTER — Encounter (HOSPITAL_BASED_OUTPATIENT_CLINIC_OR_DEPARTMENT_OTHER): Payer: Self-pay

## 2021-11-29 ENCOUNTER — Other Ambulatory Visit: Payer: Self-pay

## 2021-11-29 ENCOUNTER — Emergency Department (HOSPITAL_BASED_OUTPATIENT_CLINIC_OR_DEPARTMENT_OTHER)
Admission: EM | Admit: 2021-11-29 | Discharge: 2021-11-30 | Disposition: A | Discharge status: Home / Self Care | Payer: BLUE CROSS/BLUE SHIELD | Attending: Emergency Medicine | Admitting: Emergency Medicine

## 2021-11-29 DIAGNOSIS — S39012D Strain of muscle, fascia and tendon of lower back, subsequent encounter: Secondary | ICD-10-CM | POA: Diagnosis not present

## 2021-11-29 DIAGNOSIS — S3992XD Unspecified injury of lower back, subsequent encounter: Secondary | ICD-10-CM | POA: Diagnosis present

## 2021-11-29 DIAGNOSIS — X58XXXD Exposure to other specified factors, subsequent encounter: Secondary | ICD-10-CM | POA: Diagnosis not present

## 2021-11-29 NOTE — ED Notes (Signed)
Patient given warm blanket per request. 

## 2021-11-29 NOTE — ED Triage Notes (Signed)
Pt c/o lower back pain started 11/27-denies injury-states she was seen at [facility]-pain no better with rx med-NAD-slow shuffling gait

## 2021-11-30 NOTE — ED Notes (Signed)
Patient discharged to home.  All discharge instructions reviewed.  Patient verbalized understanding via teachback method.  VS WDL.  Respirations even and unlabored.  Ambulatory out of ED.   °

## 2021-11-30 NOTE — Discharge Instructions (Addendum)
You may use over-the-counter Motrin (Ibuprofen), Acetaminophen (Tylenol), topical muscle creams such as SalonPas, Icy Hot, Bengay, etc. Please stretch, apply ice or heat (whichever helps), and have massage therapy for additional assistance.  

## 2021-11-30 NOTE — ED Provider Notes (Signed)
[facility] EMERGENCY DEPARTMENT Provider Note  CSN: [id number] Arrival date & time: 11/29/21 2158  Chief Complaint(s) Back Pain  HPI [name] is a 35 y.o. female with past medical history listed below who presents to the emergency department with several days of lumbosacral discomfort.  This began while on a long car ride.  Started as mild ache.  Noted to be worse later that day.  Worse with certain movements and palpation.  Patient was seen at [facility] days ago and prescribed Celebrex and prednisone taper.  Pain has improved some but still present.  She denies any injuries.  No urinary symptoms.  Pain is nonradiating.  Denies any bladder/bowel incontinence.  No lower extremity weakness or loss of sensation.  No other physical complaints.   Back Pain  Past Medical History Past Medical History:  Diagnosis Date   CIN III (cervical intraepithelial neoplasia III)    History of kidney stones    Vaginal Pap smear, abnormal    Wears glasses    Patient Active Problem List   Diagnosis Date Noted   Previous cesarean section 04/02/2017   GBS (group B Streptococcus carrier), +RV culture, currently pregnant 03/20/2017   Hemorrhoids during pregnancy, antepartum, third trimester 03/12/2017   Contraception management 02/25/2017   Previous cesarean section complicating pregnancy, antepartum condition or complication 09/25/2016   Supervision of other normal pregnancy, antepartum 09/25/2016   Anxiety state, unspecified 04/06/2014   Dysplasia of cervix--HGSIL, 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  Needs colpo pp. 12/07/2013   Home Medication(s) Prior to Admission medications   Medication Sig Start Date End Date Taking? Authorizing Provider  celecoxib (CELEBREX) 200 MG capsule Take 1 capsule (200 mg total) by mouth 2 (two) times daily. 11/26/21   Arthor Captain, PA-C  ibuprofen (ADVIL) 800 MG tablet Take 1 tablet (800 mg total) by mouth 3 (three) times daily. 08/26/20   Palumbo, April, MD  medroxyPROGESTERone  Acetate 150 MG/ML SUSY INJECT 1 MILLILITER INTO THE MUSCLE EVERY 3 MONTHS 04/20/18   Brock Bad, MD  methylPREDNISolone (MEDROL DOSEPAK) 4 MG TBPK tablet Use as directed 11/26/21   Arthor Captain, PA-C  triamcinolone ointment (KENALOG) 0.5 % Apply 1 application topically 2 (two) times daily. Patient not taking: Reported on 02/12/2017 11/05/16   Roe Coombs, CNM                                                                                                                                    Past Surgical History Past Surgical History:  Procedure Laterality Date   CESAREAN SECTION  12-23-2002   CESAREAN SECTION N/A 04/07/2014   Procedure: CESAREAN SECTION;  Surgeon: Michael Litter, MD;  Location: [facility];  Service: Obstetrics;  Laterality: N/A;   CESAREAN SECTION N/A 04/02/2017   Procedure: CESAREAN SECTION;  Surgeon: [place] Bing, MD;  Location: [facility] BIRTHING SUIT[facility];  Service: Obstetrics;  Laterality: N/A;   COLPOSCOPY     LEEP  N/A 09/14/2014   Procedure: LOOP ELECTROSURGICAL EXCISION PROCEDURE (LEEP);  Surgeon: Bernita Buffy. Duard Brady, MD;  Location: [facility];  Service: Gynecology;  Laterality: N/A;   WISDOM TOOTH EXTRACTION  2012   Family History Family History  Problem Relation Age of Onset   Healthy Mother    Healthy Father    Anesthesia problems Neg Hx    Alcohol abuse Neg Hx     Social History Social History   Tobacco Use   Smoking status: Never   Smokeless tobacco: Never  Vaping Use   Vaping Use: Never used  Substance Use Topics   Alcohol use: Yes    Comment: occ   Drug use: No   Allergies Patient has no known allergies.  Review of Systems Review of Systems  Musculoskeletal:  Positive for back pain.  All other systems are reviewed and are negative for acute change except as noted in the HPI  Physical Exam Vital Signs  I have reviewed the triage vital signs BP 128/76 (BP Location: Left Arm)   Pulse (!) 55   Temp 98.4 F (36.9 C) (Oral)    Resp 18   Ht 5\' 5"  (1.651 m)   Wt 102.1 kg   LMP 11/24/2021   SpO2 100%   BMI 37.44 kg/m   Physical Exam Vitals reviewed.  Constitutional:      General: She is not in acute distress.    Appearance: She is well-developed. She is not diaphoretic.  HENT:     Head: Normocephalic and atraumatic.     Right Ear: External ear normal.     Left Ear: External ear normal.     Nose: Nose normal.  Eyes:     General: No scleral icterus.    Conjunctiva/sclera: Conjunctivae normal.  Neck:     Trachea: Phonation normal.  Cardiovascular:     Rate and Rhythm: Normal rate and regular rhythm.  Pulmonary:     Effort: Pulmonary effort is normal. No respiratory distress.     Breath sounds: No stridor.  Abdominal:     General: There is no distension.  Musculoskeletal:        General: Normal range of motion.     Cervical back: Normal range of motion.     Thoracic back: No bony tenderness.     Lumbar back: Tenderness present. No bony tenderness.       Back:  Neurological:     Mental Status: She is alert and oriented to person, place, and time.     Comments: Spine Exam: Strength: 5/5 throughout LE bilaterally  Sensation: Intact to light touch in proximal and distal LE bilaterally Reflexes: no clonus   Psychiatric:        Behavior: Behavior normal.    ED Results and Treatments Labs (all labs ordered are listed, but only abnormal results are displayed) Labs Reviewed - No data to display  EKG  EKG Interpretation  Date/Time:    Ventricular Rate:    PR Interval:    QRS Duration:   QT Interval:    QTC Calculation:   R Axis:     Text Interpretation:         Radiology No results found.  Pertinent labs & imaging results that were available during my care of the patient were reviewed by me and considered in my medical decision making (see MDM for details).  Medications  Ordered in ED Medications - No data to display                                                                                                                                   Procedures Procedures  (including critical care time)  Medical Decision Making / ED Course I have reviewed the nursing notes for this encounter and the patient's prior records (if available in EHR or on provided paperwork).  [name] was evaluated in Emergency Department on 11/30/2021 for the symptoms described in the history of present illness. She was evaluated in the context of the global COVID-19 pandemic, which necessitated consideration that the patient might be at risk for infection with the SARS-CoV-2 virus that causes COVID-19. Institutional protocols and algorithms that pertain to the evaluation of patients at risk for COVID-19 are in a state of rapid change based on information released by regulatory bodies including the CDC and federal and state organizations. These policies and algorithms were followed during the patient's care in the ED.     35 y.o. female presents with back pain in lumbar area for 5 days without signs of radicular pain. No acute traumatic onset. No red flag symptoms of fever, weight loss, saddle anesthesia, weakness, fecal/urinary incontinence or urinary retention.   Suspect MSK etiology. No indication for imaging emergently. Patient was recommended to take short course of scheduled NSAIDs and engage in early mobility as definitive treatment. Return precautions discussed for worsening or new concerning symptoms.    Final Clinical Impression(s) / ED Diagnoses Final diagnoses:  Lumbosacral strain, subsequent encounter    The patient appears reasonably screened and/or stabilized for discharge and I doubt any other medical condition or other [facility] requiring further screening, evaluation, or treatment in the ED at this time prior to discharge. Safe for discharge with strict return  precautions.  Disposition: Discharge  Condition: Good  I have discussed the results, Dx and Tx plan with the patient/family who expressed understanding and agree(s) with the plan. Discharge instructions discussed at length. The patient/family was given strict return precautions who verbalized understanding of the instructions. No further questions at time of discharge.    ED Discharge Orders     None        Follow Up: Levie Heritage, DO [address] [place] [address] [phone]  Call       This chart was dictated using voice  recognition software.  Despite best efforts to proofread,  errors can occur which can change the documentation meaning.    Nira Conn, MD 11/30/21 [phone]

## 2022-05-10 IMAGING — DX DG LUMBAR SPINE COMPLETE 4+V
5 series · 5 of 5 positions shown · non-contrast
Comparison: Lumbar spine radiograph dated 05/09/2006.

CLINICAL DATA: Back pain.

EXAM:
LUMBAR SPINE - COMPLETE 4+ VIEW

[l-spine ap]
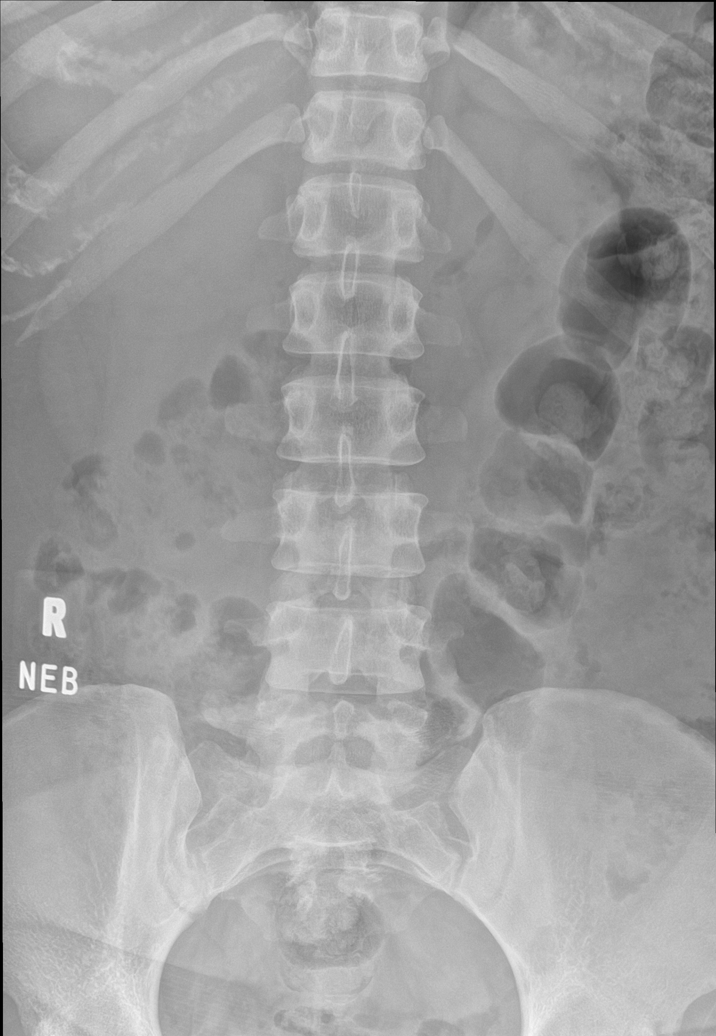

[l-spine obl (1 of 2)]
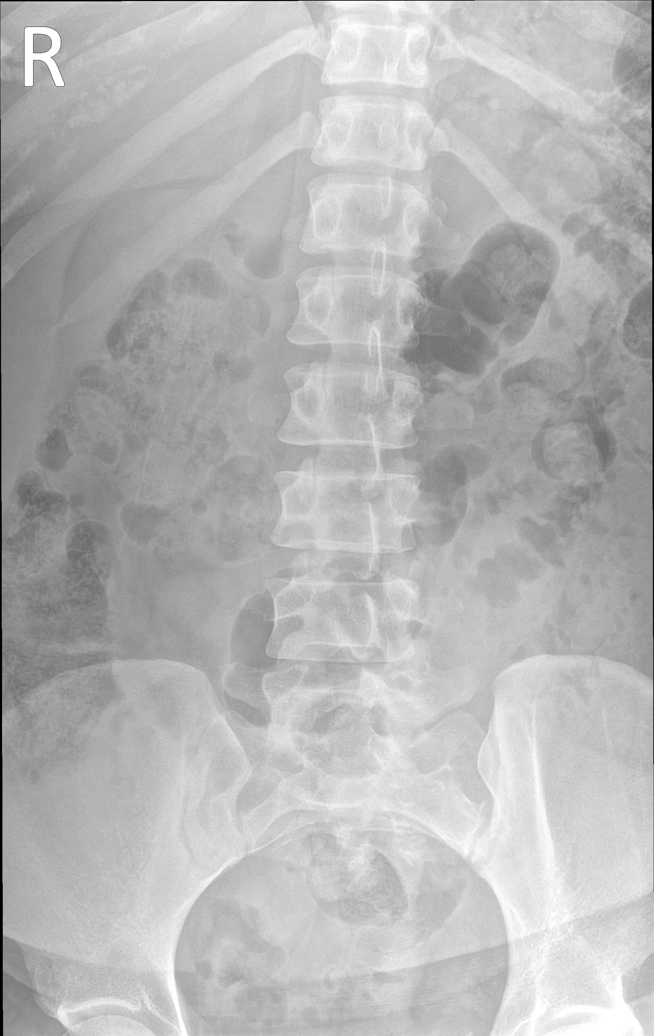

[l-spine obl (2 of 2)]
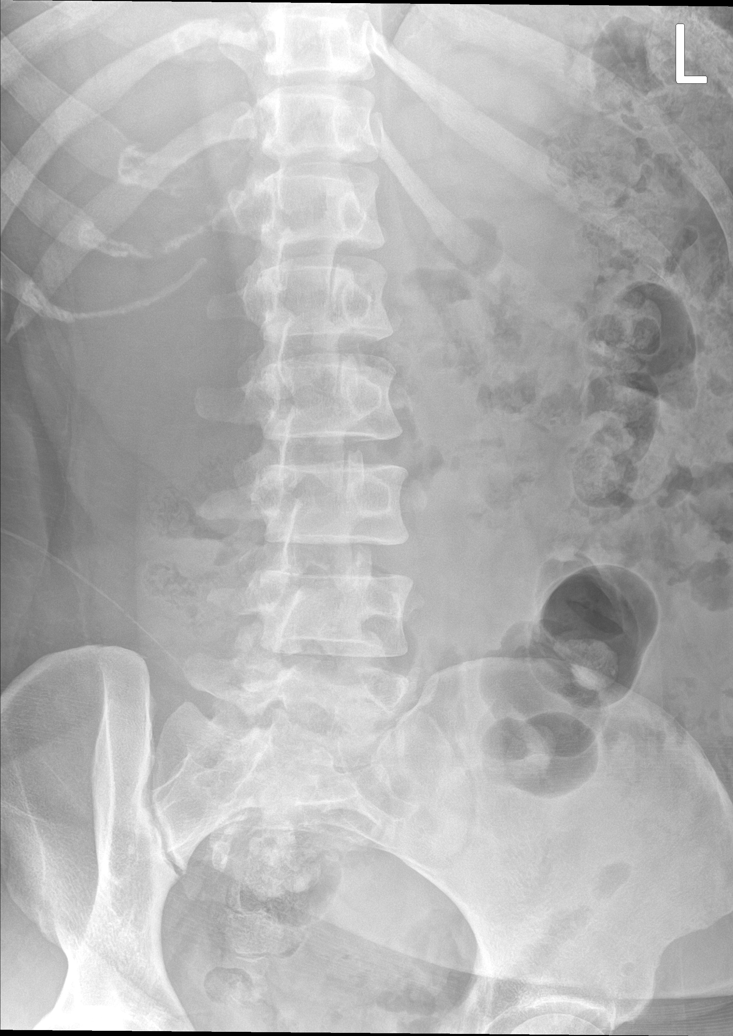

[l-spine lat]
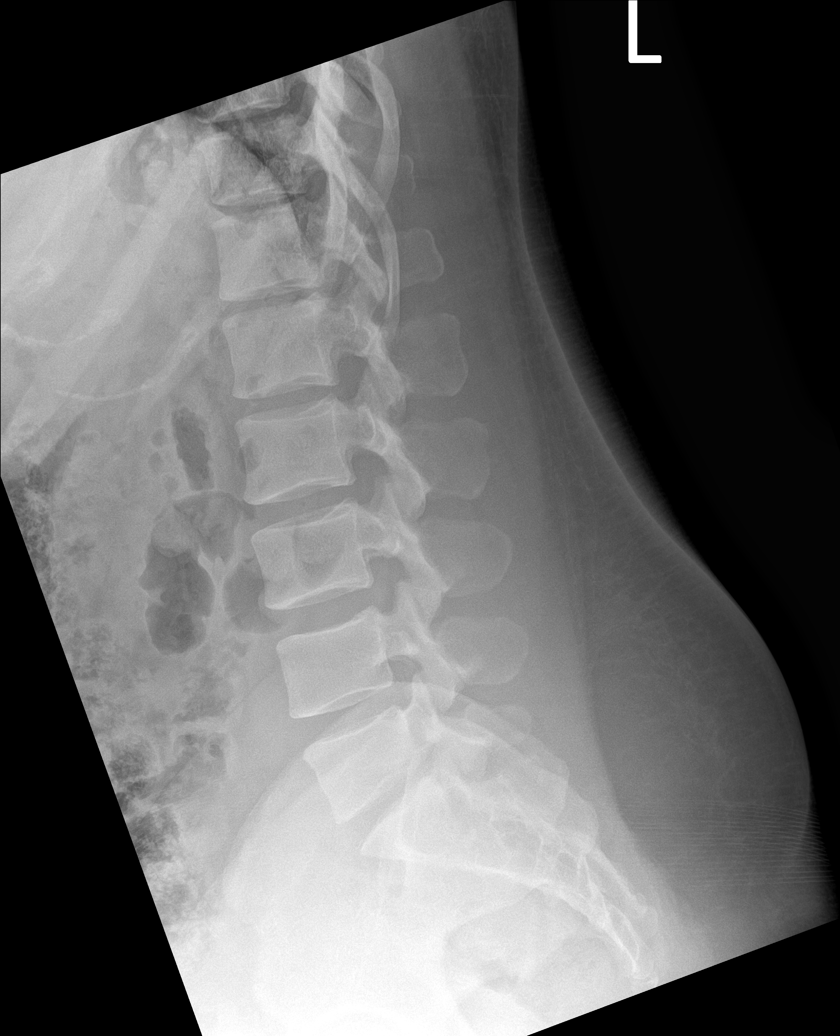

[l-spine spot]
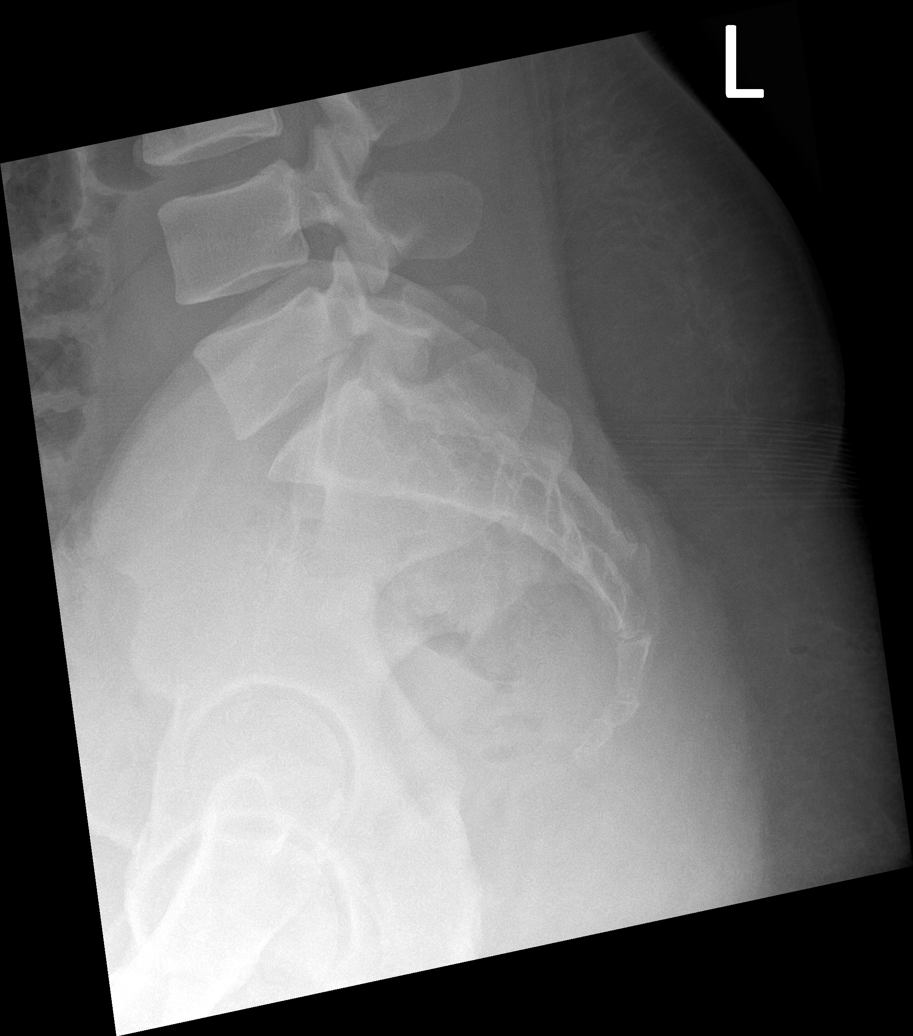

[5 of 5 positions shown; findings below may reference images not displayed]

FINDINGS: Six lumbar type vertebra. No acute fracture or subluxation of the
lumbar spine. The vertebral body heights and disc spaces are
maintained. The visualized posterior elements are intact. The soft
tissues are unremarkable.
IMPRESSION: Negative.

## 2022-05-21 ENCOUNTER — Other Ambulatory Visit: Payer: Self-pay

## 2022-05-21 ENCOUNTER — Emergency Department (HOSPITAL_COMMUNITY)
Admission: EM | Admit: 2022-05-21 | Discharge: 2022-05-22 | Disposition: A | Discharge status: Home / Self Care | Payer: 59 | Attending: Emergency Medicine | Admitting: Emergency Medicine

## 2022-05-21 ENCOUNTER — Encounter (HOSPITAL_COMMUNITY): Payer: Self-pay | Admitting: Emergency Medicine

## 2022-05-21 DIAGNOSIS — Y9301 Activity, walking, marching and hiking: Secondary | ICD-10-CM | POA: Diagnosis not present

## 2022-05-21 DIAGNOSIS — Z23 Encounter for immunization: Secondary | ICD-10-CM | POA: Diagnosis not present

## 2022-05-21 DIAGNOSIS — S0181XA Laceration without foreign body of other part of head, initial encounter: Secondary | ICD-10-CM | POA: Diagnosis not present

## 2022-05-21 DIAGNOSIS — S0990XA Unspecified injury of head, initial encounter: Secondary | ICD-10-CM | POA: Diagnosis present

## 2022-05-21 DIAGNOSIS — W109XXA Fall (on) (from) unspecified stairs and steps, initial encounter: Secondary | ICD-10-CM | POA: Diagnosis not present

## 2022-05-21 DIAGNOSIS — Y92009 Unspecified place in unspecified non-institutional (private) residence as the place of occurrence of the external cause: Secondary | ICD-10-CM | POA: Insufficient documentation

## 2022-05-21 MED ORDER — TETANUS-DIPHTH-ACELL PERTUSSIS 5-2.5-18.5 LF-MCG/0.5 IM SUSY
0.5000 mL | PREFILLED_SYRINGE | Freq: Once | INTRAMUSCULAR | Status: AC
  Administered 2022-05-22: 0.5 mL via INTRAMUSCULAR
  Filled 2022-05-21: qty 0.5

## 2022-05-21 NOTE — ED Provider Triage Note (Signed)
Emergency Medicine Provider Triage Evaluation Note  [name] , a 36 y.o. female  was evaluated in triage.  Pt complains of head laceration.  States she tripped and fell while walking in the house, missed a step walking up on the porch.  Struck face but denies LOC.  Has linear laceration to forhead.  Review of Systems  Positive: Head laceration Negative: LOC  Physical Exam  BP 119/85 (BP Location: Right Arm)   Pulse 87   Temp 98.9 F (37.2 C) (Oral)   Resp 20   Ht 5\' 5"  (1.651 m)   Wt 106.6 kg   SpO2 96%   BMI 39.11 kg/m  Gen:   Awake, no distress   Resp:  Normal effort  MSK:   Moves extremities without difficulty  Other:  Linear laceration to forehead between eyes, no facial deformities, AAOx3  Medical Decision Making  Medically screening exam initiated at 10:59 PM.  Appropriate orders placed.  [name] was informed that the remainder of the evaluation will be completed by another provider, this initial triage assessment does not replace that evaluation, and the importance of remaining in the ED until their evaluation is complete.  Head injury with laceration.  No LOC.  VSS.  AAOx3 in triage.  Not on anticoagulation.  Will update tetanus, needs lac repair.   Leeanne Rio, PA-C 05/21/22 [phone]

## 2022-05-21 NOTE — ED Triage Notes (Signed)
Patient fell this evening while walking into the house and hit her head on the door frame.  Patient has an approx 1.5 inch lac between her eyes, bleeding controlled.  Patient denies taking blood thinner and denies LOC.

## 2022-05-22 MED ORDER — LIDOCAINE HCL (PF) 1 % IJ SOLN
5.0000 mL | Freq: Once | INTRAMUSCULAR | Status: AC
  Administered 2022-05-22: 5 mL via INTRADERMAL
  Filled 2022-05-22: qty 5

## 2022-05-22 NOTE — ED Provider Notes (Signed)
[facility] EMERGENCY DEPARTMENT Provider Note   CSN: [id number] Arrival date & time: 05/21/22  2245     History  Chief Complaint  Patient presents with   Head Injury    [name] is a 36 y.o. female.  The history is provided by the patient and medical records.  Head Injury  36 year old female presenting to the ED with head laceration.  She tripped while walking up her front steps into the house.  She struck her head but denies any loss of consciousness.  Has linear laceration to forehead between eyes.  Last tetanus unknown.  Home Medications Prior to Admission medications   Medication Sig Start Date End Date Taking? Authorizing Provider  celecoxib (CELEBREX) 200 MG capsule Take 1 capsule (200 mg total) by mouth 2 (two) times daily. 11/26/21   Margarita Mail, PA-C  ibuprofen (ADVIL) 800 MG tablet Take 1 tablet (800 mg total) by mouth 3 (three) times daily. 08/26/20   Palumbo, April, MD  medroxyPROGESTERone Acetate 150 MG/ML SUSY INJECT 1 MILLILITER INTO THE MUSCLE EVERY 3 MONTHS 04/20/18   Shelly Bombard, MD  methylPREDNISolone (MEDROL DOSEPAK) 4 MG TBPK tablet Use as directed 11/26/21   Margarita Mail, PA-C  triamcinolone ointment (KENALOG) 0.5 % Apply 1 application topically 2 (two) times daily. Patient not taking: Reported on 02/12/2017 11/05/16   Morene Crocker, CNM      Allergies    Patient has no known allergies.    Review of Systems   Review of Systems  Skin:  Positive for wound.  All other systems reviewed and are negative.  Physical Exam Updated Vital Signs BP 119/85 (BP Location: Right Arm)   Pulse 87   Temp 98.9 F (37.2 C) (Oral)   Resp 20   Ht 5\' 5"  (1.651 m)   Wt 106.6 kg   SpO2 96%   BMI 39.11 kg/m  Physical Exam Vitals and nursing note reviewed.  Constitutional:      Appearance: She is well-developed.  HENT:     Head: Normocephalic and atraumatic.      Comments: 2cm laceration between the eyes, no surrounding  hematoma, bruising, or other facial deformity    Nose: No nasal deformity.     Right Nostril: No epistaxis.  Eyes:     Conjunctiva/sclera: Conjunctivae normal.     Pupils: Pupils are equal, round, and reactive to light.  Cardiovascular:     Rate and Rhythm: Normal rate and regular rhythm.     Heart sounds: Normal heart sounds.  Pulmonary:     Effort: Pulmonary effort is normal.     Breath sounds: Normal breath sounds.  Abdominal:     General: Bowel sounds are normal.     Palpations: Abdomen is soft.  Musculoskeletal:        General: Normal range of motion.     Cervical back: Normal range of motion.  Skin:    General: Skin is warm and dry.  Neurological:     Mental Status: She is alert and oriented to person, place, and time.     Comments: AAOx3, answering questions and following commands appropriately; equal strength UE and LE bilaterally; CN grossly intact; moves all extremities appropriately without ataxia; no focal neuro deficits or facial asymmetry appreciated    ED Results / Procedures / Treatments   Labs (all labs ordered are listed, but only abnormal results are displayed) Labs Reviewed - No data to display  EKG None  Radiology No results found.  Procedures Procedures    LACERATION REPAIR Performed by: Larene Pickett Authorized by: Larene Pickett Consent: Verbal consent obtained. Risks and benefits: risks, benefits and alternatives were discussed Consent given by: patient Patient identity confirmed: provided demographic data Prepped and Draped in normal sterile fashion Wound explored  Laceration Location: mid forehead  Laceration Length: 2cm  No Foreign Bodies seen or palpated  Anesthesia: local infiltration  Local anesthetic: lidocaine 1% without epinephrine  Anesthetic total: 3 ml  Irrigation method: syringe Amount of cleaning: standard  Skin closure: 5-0 prolene  Number of sutures: 2  Technique: simple interrupted  Patient tolerance:  Patient tolerated the procedure well with no immediate complications.   Medications Ordered in ED Medications  Tdap (BOOSTRIX) injection 0.5 mL (0.5 mLs Intramuscular Given 05/22/22 0054)  lidocaine (PF) (XYLOCAINE) 1 % injection 5 mL (5 mLs Intradermal Given 05/22/22 0059)    ED Course/ Medical Decision Making/ A&P                           Medical Decision Making Risk Prescription drug management.   36 year old female here with forehead laceration after tripping and falling going up her front steps.  No loss of consciousness.  She is awake, alert, oriented without focal deficits.  Tetanus updated.  Laceration repaired as above.  Given reassuring exam, do not feel she needs emergent head CT.  We will have her follow-up with PCP or urgent care for suture removal in 1 week.  Can return here for any new or acute changes.  Final Clinical Impression(s) / ED Diagnoses Final diagnoses:  Injury of head, initial encounter  Laceration of forehead, initial encounter    Rx / DC Orders ED Discharge Orders     None         Larene Pickett, PA-C [id number]    Delora Fuel, MD [id number] [phone]

## 2022-05-22 NOTE — Discharge Instructions (Signed)
Follow-up with your primary care doctor or urgent care in 1 week for suture removal. Return here for new concerns.

## 2022-05-31 ENCOUNTER — Ambulatory Visit (HOSPITAL_COMMUNITY)
Admission: EM | Admit: 2022-05-31 | Discharge: 2022-05-31 | Disposition: A | Discharge status: Home / Self Care | Payer: 59 | Attending: Internal Medicine | Admitting: Internal Medicine

## 2022-05-31 ENCOUNTER — Other Ambulatory Visit: Payer: Self-pay

## 2022-05-31 ENCOUNTER — Encounter (HOSPITAL_COMMUNITY): Payer: Self-pay | Admitting: *Deleted

## 2022-05-31 NOTE — ED Notes (Signed)
One suture removed from bridge of nose.

## 2022-05-31 NOTE — ED Triage Notes (Signed)
Pt presents for suture removal. Pt reports one suture fell out.

## 2023-08-04 ENCOUNTER — Emergency Department (HOSPITAL_COMMUNITY)
Admission: EM | Admit: 2023-08-04 | Discharge: 2023-08-05 | Disposition: A | Discharge status: Home / Self Care | Payer: 59

## 2023-08-04 ENCOUNTER — Emergency Department (HOSPITAL_COMMUNITY): Payer: 59

## 2023-08-04 ENCOUNTER — Encounter (HOSPITAL_COMMUNITY): Payer: Self-pay

## 2023-08-04 ENCOUNTER — Other Ambulatory Visit: Payer: Self-pay

## 2023-08-04 DIAGNOSIS — D649 Anemia, unspecified: Secondary | ICD-10-CM | POA: Diagnosis not present

## 2023-08-04 DIAGNOSIS — D72829 Elevated white blood cell count, unspecified: Secondary | ICD-10-CM | POA: Insufficient documentation

## 2023-08-04 DIAGNOSIS — R0602 Shortness of breath: Secondary | ICD-10-CM | POA: Insufficient documentation

## 2023-08-04 DIAGNOSIS — Z1152 Encounter for screening for COVID-19: Secondary | ICD-10-CM | POA: Insufficient documentation

## 2023-08-04 DIAGNOSIS — R079 Chest pain, unspecified: Secondary | ICD-10-CM | POA: Diagnosis present

## 2023-08-04 LAB — CBC
HCT: 36.6 % (ref 36.0–46.0)
Hemoglobin: 11.7 g/dL — ABNORMAL LOW (ref 12.0–15.0)
MCH: 28.7 pg (ref 26.0–34.0)
MCHC: 32 g/dL (ref 30.0–36.0)
MCV: 89.7 fL (ref 80.0–100.0)
Platelets: 367 10*3/uL (ref 150–400)
RBC: 4.08 MIL/uL (ref 3.87–5.11)
RDW: 13 % (ref 11.5–15.5)
WBC: 11.2 10*3/uL — ABNORMAL HIGH (ref 4.0–10.5)
nRBC: 0 % (ref 0.0–0.2)

## 2023-08-04 LAB — BASIC METABOLIC PANEL
Anion gap: 7 (ref 5–15)
BUN: 14 mg/dL (ref 6–20)
CO2: 25 mmol/L (ref 22–32)
Calcium: 8.7 mg/dL — ABNORMAL LOW (ref 8.9–10.3)
Chloride: 103 mmol/L (ref 98–111)
Creatinine, Ser: 0.82 mg/dL (ref 0.44–1.00)
GFR, Estimated: >60 mL/min (ref >60)
Glucose, Bld: 95 mg/dL (ref 70–99)
Potassium: 3.5 mmol/L (ref 3.5–5.1)
Sodium: 135 mmol/L (ref 135–145)

## 2023-08-04 LAB — HCG, SERUM, QUALITATIVE: Preg, Serum: NEGATIVE

## 2023-08-04 LAB — TROPONIN I (HIGH SENSITIVITY)
Troponin I (High Sensitivity): 2 ng/L (ref <18)
Troponin I (High Sensitivity): 2 ng/L (ref <18)

## 2023-08-04 MED ORDER — ACETAMINOPHEN 325 MG PO TABS
650.0000 mg | ORAL_TABLET | Freq: Once | ORAL | Status: AC
  Administered 2023-08-04: 650 mg via ORAL
  Filled 2023-08-04: qty 2

## 2023-08-04 NOTE — ED Triage Notes (Signed)
Pt presents via POV c/o left sided chest pain radiating under the left arm x45 mins. Pt also reports congestion and runny nose as well. Reports took Prilosec at home without relief.

## 2023-08-04 NOTE — ED Provider Notes (Signed)
[name]   CSN: [id number] Arrival date & time: 08/04/23  2103     History  Chief Complaint  Patient presents with   Chest Pain    [name] is a 37 y.o. female with past medical history significant for obesity, history of kidney stone who presents with concern for left-sided chest pain radiating to the left arm for the last 45 minutes prior to arrival.  Endorses some congestion, runny nose that started just yesterday as well.  Patient reports that she took some Prilosec at home without relief of her pain issues suspicious of gas or GI related pain.  She reports that the chest pain was sharp, stabbing initially 10/10, but now improved to 5/10.  She denies any shortness of breath, nausea, vomiting.  No history of hypertension, hyperlipidemia, diabetes, tobacco abuse, previous history of ACS, or strong family history of ACS   Chest Pain      Home Medications Prior to Admission medications   Medication Sig Start Date End Date Taking? Authorizing Provider  omeprazole (PRILOSEC OTC) 20 MG tablet Take 20 mg by mouth daily as needed (pain).   Yes [provider]  Pseudoeph-CPM-DM-APAP (TYLENOL COLD & FLU DAY/NIGHT PO) Take 15 mLs by mouth daily as needed (cold symptoms).   Yes [provider]  celecoxib (CELEBREX) 200 MG capsule Take 1 capsule (200 mg total) by mouth 2 (two) times daily. Patient not taking: Reported on 08/04/2023 11/26/21   Arthor Captain, PA-C  ibuprofen (ADVIL) 800 MG tablet Take 1 tablet (800 mg total) by mouth 3 (three) times daily. Patient not taking: Reported on 08/04/2023 08/26/20   Palumbo, April, MD  medroxyPROGESTERone Acetate 150 MG/ML SUSY INJECT 1 MILLILITER INTO THE MUSCLE EVERY 3 MONTHS Patient not taking: Reported on 08/04/2023 04/20/18   Brock Bad, MD  methylPREDNISolone (MEDROL DOSEPAK) 4 MG TBPK tablet Use as directed Patient not taking: Reported on 08/04/2023 11/26/21    Arthor Captain, PA-C  triamcinolone ointment (KENALOG) 0.5 % Apply 1 application topically 2 (two) times daily. Patient not taking: Reported on 02/12/2017 11/05/16   Roe Coombs, CNM      Allergies    Patient has no known allergies.    Review of Systems   Review of Systems  Cardiovascular:  Positive for chest pain.  All other systems reviewed and are negative.   Physical Exam Updated Vital Signs BP 131/86   Pulse 70   Temp 98.2 F (36.8 C) (Oral)   Resp 17   Ht 5\' 5"  (1.651 m)   Wt 111.1 kg   LMP 08/01/2023   SpO2 98%   BMI 40.77 kg/m  Physical Exam Vitals and nursing Ingram reviewed.  Constitutional:      General: She is not in acute distress.    Appearance: Normal appearance.  HENT:     Head: Normocephalic and atraumatic.  Eyes:     General:        Right eye: No discharge.        Left eye: No discharge.  Cardiovascular:     Rate and Rhythm: Normal rate and regular rhythm.     Heart sounds: No murmur heard.    No friction rub. No gallop.  Pulmonary:     Effort: Pulmonary effort is normal.     Breath sounds: Normal breath sounds.  Chest:     Comments: No tenderness of chest wall on my exam Abdominal:     General:  Bowel sounds are normal.     Palpations: Abdomen is soft.  Skin:    General: Skin is warm and dry.     Capillary Refill: Capillary refill takes less than 2 seconds.  Neurological:     Mental Status: She is alert and oriented to person, place, and time.  Psychiatric:        Mood and Affect: Mood normal.        Behavior: Behavior normal.     ED Results / Procedures / Treatments   Labs (all labs ordered are listed, but only abnormal results are displayed) Labs Reviewed  BASIC METABOLIC PANEL - Abnormal; Notable for the following components:      Result Value   Calcium 8.7 (*)    All other components within normal limits  CBC - Abnormal; Notable for the following components:   WBC 11.2 (*)    Hemoglobin 11.7 (*)    All other  components within normal limits  RESP PANEL BY RT-PCR (RSV, FLU A&B, COVID)  RVPGX2  HCG, SERUM, QUALITATIVE  TROPONIN I (HIGH SENSITIVITY)  TROPONIN I (HIGH SENSITIVITY)    EKG None  Radiology DG Chest 2 View  Result Date: 08/04/2023 CLINICAL DATA:  Chest pain and shortness of breath. EXAM: CHEST - 2 VIEW COMPARISON:  August 26, 2020 FINDINGS: The heart size and mediastinal contours are within normal limits. There is no evidence of an acute infiltrate, pleural effusion or pneumothorax. The visualized skeletal structures are unremarkable. IMPRESSION: No active cardiopulmonary disease. Electronically Signed   By: Aram Candela M.D.   On: 08/04/2023 21:36    Procedures Procedures    Medications Ordered in ED Medications  acetaminophen (TYLENOL) tablet 650 mg (650 mg Oral Given 08/04/23 2252)    ED Course/ Medical Decision Making/ A&P                                 Medical Decision Making Amount and/or Complexity of Data Reviewed Labs: ordered. Radiology: ordered.  Risk OTC drugs.   This patient is a 37 y.o. female  who presents to the ED for concern of chest pain.   Differential diagnoses prior to evaluation: The emergent differential diagnosis includes, but is not limited to,  ACS, AAS, PE, Mallory-Weiss, Boerhaave's, Pneumonia, acute bronchitis, asthma or COPD exacerbation, anxiety, MSK pain or traumatic injury to the chest, acid reflux versus other . This is not an exhaustive differential.   Past Medical History / Co-morbidities / Social History: Obesity, previous kidney stones, otherwise noncontributory  Physical Exam: Physical exam performed. The pertinent findings include: Overall well-appearing, exam with stable vital signs.  No significant tenderness to palpation of chest wall.  She is in no acute distress on my exam.  Lab Tests/Imaging studies: I personally interpreted labs/imaging and the pertinent results include: CBC with mild leukocytosis, white blood  cells 11.2, mild anemia, hemoglobin 11.7.  Otherwise unremarkable.  BMP is overall unremarkable.  Negative pregnancy test.  Initial troponin 2, we will obtain a second troponin in context of chest pain for less than an hour prior to arrival. Delta troponin flat. RVP pending at time of discharge.  I independently interpreted plain, chest x-ray which shows no evidence of acute intrathoracic abnormality I agree with the radiologist interpretation.  Cardiac monitoring: EKG obtained and interpreted by myself and attending physician which shows: Normal sinus rhythm   Medications: I ordered medication including Tylenol for pain.  I have reviewed  the patients home medicines and have made adjustments as needed. Patient with total resolution of pain at time of discharge.   Disposition: After consideration of the diagnostic results and the patients response to treatment, I feel that patient with resolved chest pain, suspicious for possible acid reflux, no evidence of acute ACS or other acute abnormality at this time.   emergency department workup does not suggest an emergent condition requiring admission or immediate intervention beyond what has been performed at this time. The plan is: as above. The patient is safe for discharge and has been instructed to return immediately for worsening symptoms, change in symptoms or any other concerns.  Final Clinical Impression(s) / ED Diagnoses Final diagnoses:  Chest pain, unspecified type    Rx / DC Orders ED Discharge Orders     None         Olene Floss, PA-C 08/05/23 0001    Coral Spikes, DO 08/07/23 1452

## 2024-10-12 ENCOUNTER — Other Ambulatory Visit (HOSPITAL_COMMUNITY)
Admission: RE | Admit: 2024-10-12 | Discharge: 2024-10-12 | Disposition: A | Source: Ambulatory Visit | Attending: Obstetrics | Admitting: Obstetrics

## 2024-10-12 ENCOUNTER — Encounter: Payer: Self-pay | Admitting: Obstetrics

## 2024-10-12 ENCOUNTER — Ambulatory Visit (INDEPENDENT_AMBULATORY_CARE_PROVIDER_SITE_OTHER): Admitting: Obstetrics

## 2024-10-12 VITALS — BP 131/81 | HR 73 | Ht 65.0 in | Wt 264.1 lb

## 2024-10-12 DIAGNOSIS — Z124 Encounter for screening for malignant neoplasm of cervix: Secondary | ICD-10-CM | POA: Insufficient documentation

## 2024-10-12 DIAGNOSIS — Z3201 Encounter for pregnancy test, result positive: Secondary | ICD-10-CM

## 2024-10-12 DIAGNOSIS — Z113 Encounter for screening for infections with a predominantly sexual mode of transmission: Secondary | ICD-10-CM | POA: Diagnosis not present

## 2024-10-12 DIAGNOSIS — Z01419 Encounter for gynecological examination (general) (routine) without abnormal findings: Secondary | ICD-10-CM

## 2024-10-12 DIAGNOSIS — Z6841 Body Mass Index (BMI) 40.0 and over, adult: Secondary | ICD-10-CM | POA: Diagnosis not present

## 2024-10-12 DIAGNOSIS — E66813 Obesity, class 3: Secondary | ICD-10-CM | POA: Diagnosis not present

## 2024-10-12 DIAGNOSIS — Z1151 Encounter for screening for human papillomavirus (HPV): Secondary | ICD-10-CM | POA: Diagnosis not present

## 2024-10-12 DIAGNOSIS — N898 Other specified noninflammatory disorders of vagina: Secondary | ICD-10-CM

## 2024-10-12 LAB — POCT URINE PREGNANCY: Preg Test, Ur: POSITIVE — AB

## 2024-10-12 NOTE — Progress Notes (Signed)
 Pt presents for pregnancy test. Had 2 positive test at home.

## 2024-10-12 NOTE — Progress Notes (Signed)
 Subjective:        [name] is a 38 y.o. female here for a routine exam.  Current complaints: Missed periods.    Personal health questionnaire:  Is patient Ashkenazi Jewish, have a family history of breast and/or ovarian cancer: no Is there a family history of uterine cancer diagnosed at age < 48, gastrointestinal cancer, urinary tract cancer, family member who is a Personnel officer syndrome-associated carrier: no Is the patient overweight and hypertensive, family history of diabetes, personal history of gestational diabetes, preeclampsia or PCOS: no Is patient over 42, have PCOS,  family history of premature CHD under age 40, diabetes, smoke, have hypertension or peripheral artery disease:  no At any time, has a partner hit, kicked or otherwise hurt or frightened you?: no Over the past 2 weeks, have you felt down, depressed or hopeless?: no Over the past 2 weeks, have you felt little interest or pleasure in doing things?:no   Gynecologic History Patient's last menstrual period was 08/27/2024 (approximate). Contraception: none Last Pap: 2017. Results were: normal Last mammogram: n/a. Results were: n/a  Obstetric History OB History  Gravida Para Term Preterm AB Living  4 3 3  1 3   SAB IAB Ectopic Multiple Live Births  1   0 3    # Outcome Date GA Lbr Len/2nd Weight Sex Type Anes PTL Lv  4 Term 04/02/17 [redacted]w[redacted]d  8 lb 0.4 oz (3.64 kg) F CS-LTranv Spinal  LIV  3 Term 04/07/14 [redacted]w[redacted]d  8 lb 11.5 oz (3.955 kg) F CS-LTranv   LIV     Birth Comments: No problems at birth  2 SAB 10/31/11          1 Term 12/23/02   8 lb 12 oz (3.969 kg) M CS-LTranv  N LIV     Birth Comments: pushed- pt states 23hr    Past Medical History:  Diagnosis Date   CIN III (cervical intraepithelial neoplasia III)    History of kidney stones    Vaginal Pap smear, abnormal    Wears glasses     Past Surgical History:  Procedure Laterality Date   CESAREAN SECTION  12-23-2002   CESAREAN SECTION N/A 04/07/2014    Procedure: CESAREAN SECTION;  Surgeon: Ovid DELENA All, MD;  Location: [facility];  Service: Obstetrics;  Laterality: N/A;   CESAREAN SECTION N/A 04/02/2017   Procedure: CESAREAN SECTION;  Surgeon: Bebe Furry, MD;  Location: [facility];  Service: Obstetrics;  Laterality: N/A;   COLPOSCOPY     LEEP N/A 09/14/2014   Procedure: LOOP ELECTROSURGICAL EXCISION PROCEDURE (LEEP);  Surgeon: Elenore DELENA. Dodie, MD;  Location: [facility];  Service: Gynecology;  Laterality: N/A;   WISDOM TOOTH EXTRACTION  2012     Current Outpatient Medications:    celecoxib  (CELEBREX ) 200 MG capsule, Take 1 capsule (200 mg total) by mouth 2 (two) times daily. (Patient not taking: Reported on 10/12/2024), Disp: 20 capsule, Rfl: 0   ibuprofen  (ADVIL ) 800 MG tablet, Take 1 tablet (800 mg total) by mouth 3 (three) times daily. (Patient not taking: Reported on 10/12/2024), Disp: 21 tablet, Rfl: 0   medroxyPROGESTERone  Acetate 150 MG/ML SUSY, INJECT 1 MILLILITER INTO THE MUSCLE EVERY 3 MONTHS (Patient not taking: Reported on 10/12/2024), Disp: 1 Syringe, Rfl: 2   methylPREDNISolone  (MEDROL  DOSEPAK) 4 MG TBPK tablet, Use as directed (Patient not taking: Reported on 10/12/2024), Disp: 21 tablet, Rfl: 0   omeprazole (PRILOSEC OTC) 20 MG tablet, Take 20 mg by mouth daily  as needed (pain). (Patient not taking: Reported on 10/12/2024), Disp: , Rfl:    Pseudoeph-CPM-DM-APAP (TYLENOL  COLD & FLU DAY/NIGHT PO), Take 15 mLs by mouth daily as needed (cold symptoms). (Patient not taking: Reported on 10/12/2024), Disp: , Rfl:    triamcinolone  ointment (KENALOG ) 0.5 %, Apply 1 application topically 2 (two) times daily. (Patient not taking: Reported on 10/12/2024), Disp: 90 g, Rfl: PRN  Current Facility-Administered Medications:    ceFAZolin  (ANCEF ) 2 g in dextrose  5 % 100 mL IVPB, 2 g, Intravenous, 30 min Pre-Op, Izell Harari, MD   sodium citrate -citric acid  (ORACIT) solution 30 mL, 30 mL, Oral, 30 min Pre-Op, Izell Harari, MD No Known Allergies  Social History   Tobacco Use   Smoking status: Never   Smokeless tobacco: Never  Substance Use Topics   Alcohol use: Yes    Comment: occ    Family History  Problem Relation Age of Onset   Healthy Mother    Healthy Father    Anesthesia problems Neg Hx    Alcohol abuse Neg Hx       Review of Systems  Constitutional: negative for fatigue and weight loss Respiratory: negative for cough and wheezing Cardiovascular: negative for chest pain, fatigue and palpitations Gastrointestinal: negative for abdominal pain and change in bowel habits Musculoskeletal:negative for myalgias Neurological: negative for gait problems and tremors Behavioral/Psych: negative for abusive relationship, depression Endocrine: negative for temperature intolerance    Genitourinary: positive for vaginal discharge and missed periods.  negative for, genital lesions, hot flashes, sexual problems  Integument/breast: negative for breast lump, breast tenderness, nipple discharge and skin lesion(s)    Objective:       BP 131/81   Pulse 73   Ht 5' 5 (1.651 m)   Wt 264 lb 1.6 oz (119.8 kg)   LMP 08/27/2024 (Approximate)   BMI 43.95 kg/m  General:   Alert and no distress  Skin:   no rash or abnormalities  Lungs:   clear to auscultation bilaterally  Heart:   regular rate and rhythm, S1, S2 normal, no murmur, click, rub or gallop  Breasts:   normal without suspicious masses, skin or nipple changes or axillary nodes  Abdomen:  normal findings: no organomegaly, soft, non-tender and no hernia  Pelvis:  External genitalia: normal general appearance Urinary system: urethral meatus normal and bladder without fullness, nontender Vaginal: normal without tenderness, induration or masses Cervix: normal appearance Adnexa: normal bimanual exam Uterus: anteverted and non-tender, normal size   Lab Review Urine pregnancy test Labs reviewed yes Radiologic studies reviewed no  I have  spent a total of 20 minutes of face-to-face time, excluding clinical staff time, reviewing notes and preparing to see patient, ordering tests and/or medications, and counseling the patient.   Assessment:    1. Encounter for gynecological examination with Papanicolaou smear of cervix (Primary) Rx: - Cytology - PAP( [place]) - POCT urine pregnancy  2. Vaginal discharge Rx: - Cervicovaginal ancillary only( [place])  3. Class 3 severe obesity due to excess calories without serious comorbidity with body mass index (BMI) of 40.0 to 44.9 in adult (HCC) - weight reduction with the aid of dietary changes, exercise and behavioral modification recommended  4. Pregnancy test positive     Plan:    Education reviewed: calcium supplements, depression evaluation, low fat, low cholesterol diet, safe sex/STD prevention, self breast exams, and weight bearing exercise. Follow up in: 1 year.    Orders Placed This Encounter  Procedures   POCT  urine pregnancy    CARLIN RONAL CENTERS, MD, FACOG Attending Obstetrician & Gynecologist, [facility] for [facility], [facility], [place] 10/12/2024

## 2024-10-13 ENCOUNTER — Encounter

## 2024-10-14 ENCOUNTER — Inpatient Hospital Stay (HOSPITAL_COMMUNITY)

## 2024-10-14 ENCOUNTER — Inpatient Hospital Stay (HOSPITAL_COMMUNITY)
Admission: AD | Admit: 2024-10-14 | Discharge: 2024-10-14 | Disposition: A | Discharge status: Home / Self Care | Attending: Obstetrics and Gynecology | Admitting: Obstetrics and Gynecology

## 2024-10-14 DIAGNOSIS — R102 Pelvic and perineal pain unspecified side: Secondary | ICD-10-CM

## 2024-10-14 DIAGNOSIS — Z3A01 Less than 8 weeks gestation of pregnancy: Secondary | ICD-10-CM | POA: Insufficient documentation

## 2024-10-14 DIAGNOSIS — O34219 Maternal care for unspecified type scar from previous cesarean delivery: Secondary | ICD-10-CM | POA: Diagnosis not present

## 2024-10-14 DIAGNOSIS — R109 Unspecified abdominal pain: Secondary | ICD-10-CM | POA: Insufficient documentation

## 2024-10-14 DIAGNOSIS — O26891 Other specified pregnancy related conditions, first trimester: Secondary | ICD-10-CM | POA: Diagnosis present

## 2024-10-14 LAB — CERVICOVAGINAL ANCILLARY ONLY
Bacterial Vaginitis (gardnerella): POSITIVE — AB
Candida Glabrata: NEGATIVE
Candida Vaginitis: NEGATIVE
Chlamydia: NEGATIVE
Comment: NEGATIVE
Comment: NEGATIVE
Comment: NEGATIVE
Comment: NEGATIVE
Comment: NEGATIVE
Comment: NORMAL
Neisseria Gonorrhea: NEGATIVE
Trichomonas: NEGATIVE

## 2024-10-14 NOTE — MAU Provider Note (Signed)
 History     Chief Complaint  Patient presents with   Abdominal Pain   38 yo G5P3013 BF LMP 8/29 (+) SPT presents with c/o abdominal pain that has since resolved. Denies vaginal bleeding . BM not regular. No urinary sx. No assoc n/v. No problem with pain while driving to here Pt had just had annual exam  2 days ago with Dr Rudy. Hx C/S  OB History     Gravida  5   Para  3   Term  3   Preterm      AB  1   Living  3      SAB  1   IAB      Ectopic      Multiple  0   Live Births  3           Past Medical History:  Diagnosis Date   CIN III (cervical intraepithelial neoplasia III)    History of kidney stones    Vaginal Pap smear, abnormal    Wears glasses     Past Surgical History:  Procedure Laterality Date   CESAREAN SECTION  12-23-2002   CESAREAN SECTION N/A 04/07/2014   Procedure: CESAREAN SECTION;  Surgeon: Ovid DELENA All, MD;  Location: [facility];  Service: Obstetrics;  Laterality: N/A;   CESAREAN SECTION N/A 04/02/2017   Procedure: CESAREAN SECTION;  Surgeon: Bebe Furry, MD;  Location: [facility]TH[facility];  Service: Obstetrics;  Laterality: N/A;   COLPOSCOPY     LEEP N/A 09/14/2014   Procedure: LOOP ELECTROSURGICAL EXCISION PROCEDURE (LEEP);  Surgeon: Elenore DELENA. Dodie, MD;  Location: [facility];  Service: Gynecology;  Laterality: N/A;   WISDOM TOOTH EXTRACTION  2012    Family History  Problem Relation Age of Onset   Healthy Mother    Healthy Father    Anesthesia problems Neg Hx    Alcohol abuse Neg Hx     Social History   Tobacco Use   Smoking status: Never   Smokeless tobacco: Never  Vaping Use   Vaping status: Never Used  Substance Use Topics   Alcohol use: Yes    Comment: occ   Drug use: No    Allergies: No Known Allergies  Facility-Administered Medications Prior to Admission  Medication Dose Route Frequency Provider Last Rate Last Admin   ceFAZolin  (ANCEF ) 2 g in dextrose  5 % 100 mL IVPB  2 g Intravenous 30 min  Pre-Op Furry Bebe, MD       sodium citrate -citric acid  (ORACIT) solution 30 mL  30 mL Oral 30 min Pre-Op Furry Bebe, MD       Medications Prior to Admission  Medication Sig Dispense Refill Last Dose/Taking   celecoxib  (CELEBREX ) 200 MG capsule Take 1 capsule (200 mg total) by mouth 2 (two) times daily. (Patient not taking: Reported on 10/12/2024) 20 capsule 0    ibuprofen  (ADVIL ) 800 MG tablet Take 1 tablet (800 mg total) by mouth 3 (three) times daily. (Patient not taking: Reported on 10/12/2024) 21 tablet 0    medroxyPROGESTERone  Acetate 150 MG/ML SUSY INJECT 1 MILLILITER INTO THE MUSCLE EVERY 3 MONTHS (Patient not taking: Reported on 10/12/2024) 1 Syringe 2    methylPREDNISolone  (MEDROL  DOSEPAK) 4 MG TBPK tablet Use as directed (Patient not taking: Reported on 10/12/2024) 21 tablet 0    omeprazole (PRILOSEC OTC) 20 MG tablet Take 20 mg by mouth daily as needed (pain). (Patient not taking: Reported on 10/12/2024)      Pseudoeph-CPM-DM-APAP (TYLENOL  COLD &  FLU DAY/NIGHT PO) Take 15 mLs by mouth daily as needed (cold symptoms). (Patient not taking: Reported on 10/12/2024)      triamcinolone  ointment (KENALOG ) 0.5 % Apply 1 application topically 2 (two) times daily. (Patient not taking: Reported on 10/12/2024) 90 g PRN      Physical Exam   Blood pressure (!) 149/83, pulse 73, height 5' 5 (1.651 m), weight 120.2 kg, last menstrual period 08/27/2024, SpO2 100%.  General appearance: alert, cooperative, and no distress Lungs: clear to auscultation bilaterally Heart: regular rate and rhythm   ED Course  IMP; abdominal pain in pregnancy P) pelvic sonogram MDM  Addendum US  OB Transvaginal Result Date: 10/14/2024 CLINICAL DATA:  Pelvic pain. Positive pregnancy. LMP: 08/27/2024 corresponding to an estimated gestational age of [redacted] weeks, 6 days. EXAM: TRANSVAGINAL OB ULTRASOUND TECHNIQUE: Transvaginal ultrasound was performed for complete evaluation of the gestation as well as the  maternal uterus, adnexal regions, and pelvic cul-de-sac. COMPARISON:  None Available. FINDINGS: Intrauterine gestational sac: Single intrauterine gestational sac. Yolk sac:  Seen Embryo:  Present Cardiac Activity: Detected Heart Rate: 143 bpm CRL:   12 mm   7 w 3 d                  US  EDC: 05/30/2025 Subchorionic hemorrhage:  None visualized. Maternal uterus/adnexae: The maternal ovaries are not visualized. There is heterogeneity and prominence of the soft tissue of the left myometrium which may be related to a fibroid. IMPRESSION: 1. Single live intrauterine pregnancy with an estimated gestational age of [redacted] weeks, 3 days by ultrasound. 2. Possible left uterine fibroid. Electronically Signed   By: Vanetta Chou M.D.   On: 10/14/2024 21:22   Reviewed results with pt> Per pt, may have been related to gas Reassurance given D/c home. Advised to schedule OB appts  Dickie DELENA Carder, MD 8:51 PM 10/14/2024

## 2024-10-14 NOTE — MAU Note (Signed)
 [name] is a 38 y.o. at [redacted]w[redacted]d here in MAU reporting abdominal pain yesterday which stopped last night. Dr Rutherford in Triage to see pt and talk with her. No vag bleeding today and no pain.   LMP: 08/27/24 Onset of complaint: yesterday Pain score: 0 Vitals:   10/14/24 2037  Pulse: 73  SpO2: 100%     FHT: na  Lab orders placed from triage: none

## 2024-10-14 NOTE — Progress Notes (Signed)
 Dr  Rutherford talked with the pt on the phone and discussed u/s results and follow up. Written and verbal d/c instructions given and pt voiced understanding.

## 2024-10-15 ENCOUNTER — Encounter

## 2024-10-15 ENCOUNTER — Ambulatory Visit: Payer: Self-pay | Admitting: Obstetrics

## 2024-10-15 DIAGNOSIS — B9689 Other specified bacterial agents as the cause of diseases classified elsewhere: Secondary | ICD-10-CM

## 2024-10-15 LAB — CYTOLOGY - PAP
Adequacy: ABSENT
Comment: NEGATIVE
Comment: NEGATIVE
Comment: NEGATIVE
Diagnosis: HIGH — AB
HPV 16: NEGATIVE
HPV 18 / 45: NEGATIVE
High risk HPV: POSITIVE — AB

## 2024-10-15 MED ORDER — METRONIDAZOLE 500 MG PO TABS: 500.0000 mg | ORAL_TABLET | Freq: Two times a day (BID) | ORAL | 2 refills | Status: DC

## 2024-10-27 ENCOUNTER — Encounter

## 2024-11-25 ENCOUNTER — Inpatient Hospital Stay (HOSPITAL_COMMUNITY)
Admission: AD | Admit: 2024-11-25 | Discharge: 2024-11-26 | Disposition: A | Discharge status: Home / Self Care | Attending: Obstetrics and Gynecology | Admitting: Obstetrics and Gynecology

## 2024-11-25 ENCOUNTER — Other Ambulatory Visit: Payer: Self-pay

## 2024-11-25 DIAGNOSIS — O219 Vomiting of pregnancy, unspecified: Secondary | ICD-10-CM | POA: Diagnosis present

## 2024-11-25 DIAGNOSIS — Z3A12 12 weeks gestation of pregnancy: Secondary | ICD-10-CM | POA: Insufficient documentation

## 2024-11-25 LAB — URINALYSIS, ROUTINE W REFLEX MICROSCOPIC
Bilirubin Urine: NEGATIVE
Glucose, UA: NEGATIVE mg/dL
Ketones, ur: NEGATIVE mg/dL
Leukocytes,Ua: NEGATIVE
Nitrite: NEGATIVE
Protein, ur: NEGATIVE mg/dL
Specific Gravity, Urine: 1.02 (ref 1.005–1.030)
pH: 7 (ref 5.0–8.0)

## 2024-11-25 NOTE — MAU Note (Signed)
 [name] is a 38 y.o. at [redacted]w[redacted]d here in MAU reporting: vomiting all day - unable to keep anything down. Emesis x5. Denies pain, VB, or abnormal discharge. Reports she's had nausea with the pregnancy but has not had vomiting until today. Does not have nausea medications. Last episode of vomiting was 2 hours ago - now I just feel weak.   Onset of complaint: all day  Pain score: 0 Vitals:   11/25/24 2300  BP: 127/69  Pulse: 80  Resp: 18  Temp: 98.6 F (37 C)  SpO2: 99%     FHT: 155  Lab orders placed from triage: UA

## 2024-11-26 MED ORDER — SCOPOLAMINE 1 MG/3DAYS TD PT72: 1.0000 | MEDICATED_PATCH | TRANSDERMAL | 12 refills | Status: AC

## 2024-11-26 MED ORDER — SCOPOLAMINE 1 MG/3DAYS TD PT72
1.0000 | MEDICATED_PATCH | TRANSDERMAL | Status: DC
  Administered 2024-11-26: 1 mg via TRANSDERMAL
  Filled 2024-11-26: qty 1

## 2024-11-26 MED ORDER — PROMETHAZINE HCL 25 MG PO TABS: 25.0000 mg | ORAL_TABLET | Freq: Four times a day (QID) | ORAL | 1 refills | Status: AC | PRN

## 2024-11-26 NOTE — MAU Provider Note (Signed)
 History     Chief Complaint  Patient presents with   Emesis   Nausea  38 yo G5P3013  MF @ 12 6/[redacted] wk gestation presents with c/o n/v all day. Last episode 2 hrs ago. Able to drink fluid Pt notes feeling weak.  Has been nauseous throughout pregnancy   OB History     Gravida  5   Para  3   Term  3   Preterm      AB  1   Living  3      SAB  1   IAB      Ectopic      Multiple  0   Live Births  3           Past Medical History:  Diagnosis Date   CIN III (cervical intraepithelial neoplasia III)    History of kidney stones    Vaginal Pap smear, abnormal    Wears glasses     Past Surgical History:  Procedure Laterality Date   CESAREAN SECTION  12-23-2002   CESAREAN SECTION N/A 04/07/2014   Procedure: CESAREAN SECTION;  Surgeon: Ovid DELENA All, MD;  Location: [facility];  Service: Obstetrics;  Laterality: N/A;   CESAREAN SECTION N/A 04/02/2017   Procedure: CESAREAN SECTION;  Surgeon: Bebe Furry, MD;  Location: [facility]IRTHING SUIT[facility];  Service: Obstetrics;  Laterality: N/A;   COLPOSCOPY     LEEP N/A 09/14/2014   Procedure: LOOP ELECTROSURGICAL EXCISION PROCEDURE (LEEP);  Surgeon: Elenore DELENA. Dodie, MD;  Location: [facility];  Service: Gynecology;  Laterality: N/A;   WISDOM TOOTH EXTRACTION  2012    Family History  Problem Relation Age of Onset   Healthy Mother    Healthy Father    Anesthesia problems Neg Hx    Alcohol abuse Neg Hx     Social History   Tobacco Use   Smoking status: Never   Smokeless tobacco: Never  Vaping Use   Vaping status: Never Used  Substance Use Topics   Alcohol use: Yes    Comment: occ   Drug use: No    Allergies: No Known Allergies  Medications Prior to Admission  Medication Sig Dispense Refill Last Dose/Taking   prenatal vitamin w/FE, FA (NATACHEW) 29-1 MG CHEW chewable tablet Chew 1 tablet by mouth daily at 12 noon.   11/25/2024   metroNIDAZOLE (FLAGYL) 500 MG tablet Take 1 tablet (500 mg total) by mouth 2  (two) times daily. (Patient not taking: Reported on 11/25/2024) 14 tablet 2 Not Taking     Physical Exam   Blood pressure 127/69, pulse 80, temperature 98.6 F (37 C), temperature source Oral, resp. rate 18, height 5' 5 (1.651 m), weight 119.6 kg, last menstrual period 08/27/2024, SpO2 99%.   Orthostatic: lying  BP 129/70 P82 Siting  BP 134/70 P71 Standing 135/73 P77 General appearance: cooperative and no distress Lungs: clear to auscultation bilaterally Heart: regular rate and rhythm, S1, S2 normal, no murmur, click, rub or gallop  Results for orders placed or performed during the hospital encounter of 11/25/24 (from the past 24 hours)  Urinalysis, Routine w reflex microscopic -Urine, Clean Catch     Status: Abnormal   Collection Time: 11/25/24 11:12 PM  Result Value Ref Range   Color, Urine YELLOW YELLOW   APPearance HAZY (A) CLEAR   Specific Gravity, Urine 1.020 1.005 - 1.030   pH 7.0 5.0 - 8.0   Glucose, UA NEGATIVE NEGATIVE mg/dL   Hgb urine dipstick MODERATE (A)  NEGATIVE   Bilirubin Urine NEGATIVE NEGATIVE   Ketones, ur NEGATIVE NEGATIVE mg/dL   Protein, ur NEGATIVE NEGATIVE mg/dL   Nitrite NEGATIVE NEGATIVE   Leukocytes,Ua NEGATIVE NEGATIVE   RBC / HPF 6-10 0 - 5 RBC/hpf   WBC, UA 0-5 0 - 5 WBC/hpf   Bacteria, UA RARE (A) NONE SEEN   Squamous Epithelial / HPF 11-20 0 - 5 /HPF   Mucus PRESENT     ED Course  IMP: nausea/vomiting affecting pregnancy IUP @ 12 6/7 wk Non orthostatic vital signs, no ketones on urine Pt declines IV fluid P) scopolamine  patch now and q 3 days. Phenergan  25 mg po q6 hrs prn D/c home. Freq small intake MDM   Dickie DELENA Carder, MD 12:10 AM 11/26/2024

## 2024-11-27 LAB — CULTURE, OB URINE: Special Requests: NORMAL

## 2025-01-26 ENCOUNTER — Encounter (HOSPITAL_BASED_OUTPATIENT_CLINIC_OR_DEPARTMENT_OTHER): Payer: Self-pay

## 2025-01-26 ENCOUNTER — Other Ambulatory Visit: Payer: Self-pay

## 2025-01-26 ENCOUNTER — Emergency Department (HOSPITAL_BASED_OUTPATIENT_CLINIC_OR_DEPARTMENT_OTHER)
Admission: EM | Admit: 2025-01-26 | Discharge: 2025-01-26 | Disposition: A | Discharge status: Home / Self Care | Attending: Emergency Medicine | Admitting: Emergency Medicine

## 2025-01-26 DIAGNOSIS — Z3A21 21 weeks gestation of pregnancy: Secondary | ICD-10-CM | POA: Insufficient documentation

## 2025-01-26 DIAGNOSIS — O98512 Other viral diseases complicating pregnancy, second trimester: Secondary | ICD-10-CM | POA: Insufficient documentation

## 2025-01-26 DIAGNOSIS — O26892 Other specified pregnancy related conditions, second trimester: Secondary | ICD-10-CM | POA: Diagnosis present

## 2025-01-26 DIAGNOSIS — J101 Influenza due to other identified influenza virus with other respiratory manifestations: Secondary | ICD-10-CM | POA: Insufficient documentation

## 2025-01-26 LAB — RESP PANEL BY RT-PCR (RSV, FLU A&B, COVID)  RVPGX2
Influenza A by PCR: POSITIVE — AB
Influenza B by PCR: NEGATIVE
Resp Syncytial Virus by PCR: NEGATIVE
SARS Coronavirus 2 by RT PCR: NEGATIVE

## 2025-01-26 NOTE — ED Provider Notes (Signed)
 " [place] EMERGENCY DEPARTMENT AT [facility] Provider Note   CSN: [id number] Arrival date & time: 01/26/25  1907     Patient presents with: Influenza (/)   [name] is a 39 y.o. female.   Patient is here for evaluation of bodyaches, runny nose, and cough.  She is accompanied by her husband today.  She started having runny nose and cough yesterday, followed by body aches today.  No recorded fevers.  She was exposed to two flu positive children recently.  She has not taken any over-the-counter medications for this.  Denies N/V/D, chest pain, dizziness, or syncope.  Patient is currently [redacted] weeks pregnant and states she does have baseline shortness of breath due to this, going on to say that it is no worse than normal.  Denies history of asthma.  Denies ear pain or drainage.  The history is provided by the patient.  Influenza Presenting symptoms: cough and rhinorrhea        Prior to Admission medications  Medication Sig Start Date End Date Taking? Authorizing Provider  prenatal vitamin w/FE, FA (NATACHEW) 29-1 MG CHEW chewable tablet Chew 1 tablet by mouth daily at 12 noon.    [provider]  promethazine  (PHENERGAN ) 25 MG tablet Take 1 tablet (25 mg total) by mouth every 6 (six) hours as needed for nausea or vomiting. 11/26/24   Rutherford Gain, MD  scopolamine  (TRANSDERM-SCOP) 1 MG/3DAYS Place 1 patch (1 mg total) onto the skin every 3 (three) days. 11/26/24   Rutherford Gain, MD    Allergies: Patient has no known allergies.    Review of Systems  HENT:  Positive for rhinorrhea.   Respiratory:  Positive for cough.    Vitals:   01/26/25 1914 01/26/25 1915  BP:  124/83  Pulse: (!) 102   Resp: 18   Temp: 98.4 F (36.9 C)   SpO2: 100%     Updated Vital Signs BP 124/83   Pulse (!) 102   Temp 98.4 F (36.9 C) (Oral)   Resp 18   Ht 5' 5 (1.651 m)   Wt 120.2 kg   LMP 08/27/2024 (Approximate)   SpO2 100%   BMI 44.10 kg/m   Physical  Exam Vitals and nursing note reviewed.  Constitutional:      General: She is not in acute distress.    Appearance: Normal appearance. She is not ill-appearing, toxic-appearing or diaphoretic.  HENT:     Head: Normocephalic and atraumatic.     Right Ear: Tympanic membrane, ear canal and external ear normal. There is no impacted cerumen.     Left Ear: Tympanic membrane, ear canal and external ear normal. There is no impacted cerumen.     Nose: Rhinorrhea present.     Mouth/Throat:     Mouth: Mucous membranes are moist.     Pharynx: Oropharynx is clear. No oropharyngeal exudate or posterior oropharyngeal erythema.     Comments: Uvula deviated to the left, patient states this is baseline. Eyes:     General: No scleral icterus.       Right eye: No discharge.        Left eye: No discharge.     Extraocular Movements: Extraocular movements intact.     Conjunctiva/sclera: Conjunctivae normal.     Pupils: Pupils are equal, round, and reactive to light.  Cardiovascular:     Rate and Rhythm: Normal rate and regular rhythm.     Pulses: Normal pulses.     Heart sounds: Normal  heart sounds.  Pulmonary:     Effort: Pulmonary effort is normal. No respiratory distress.     Breath sounds: Normal breath sounds. No stridor. No wheezing, rhonchi or rales.  Abdominal:     General: Abdomen is flat. Bowel sounds are normal. There is no distension (pregnant).     Palpations: Abdomen is soft.     Tenderness: There is no abdominal tenderness. There is no guarding.  Musculoskeletal:        General: Normal range of motion.     Cervical back: Normal range of motion and neck supple. No rigidity or tenderness.  Lymphadenopathy:     Cervical: No cervical adenopathy.  Skin:    General: Skin is warm and dry.     Capillary Refill: Capillary refill takes less than 2 seconds.     Coloration: Skin is not jaundiced or pale.  Neurological:     Mental Status: She is alert and oriented to person, place, and time.      (all labs ordered are listed, but only abnormal results are displayed) Labs Reviewed  RESP PANEL BY RT-PCR (RSV, FLU A&B, COVID)  [id number] - Abnormal; Notable for the following components:      Result Value   Influenza A by PCR POSITIVE (*)    All other components within normal limits    EKG: None  Radiology: No results found.   Procedures   Medications Ordered in the ED - No data to display   Patient presents to the ED for concern of flu like symptoms, this involves an extensive number of treatment options, and is a complaint that carries with it a high risk of complications and morbidity.  The differential diagnosis includes viral URI, influenza, COVID, RSV, pneumonia.   Co morbidities that complicate the patient evaluation  [redacted] weeks pregnant   Lab Tests:  I Ordered, and personally interpreted labs.  The pertinent results include: Respiratory panel positive for influenza A.   Problem List / ED Course:     Influenza A. Physical exam, vitals, and history are consistent with viral URI with no acute or concerning findings.  Respiratory panel positive for influenza A.  Spoke with patient and her husband about diagnosis, outpatient treatment, return precautions and they verbalized understanding.  Stable for discharge.   Reevaluation:  After the interventions noted above, I reevaluated the patient and found that they have :improved    Dispostion:  After consideration of the diagnostic results and the patients response to treatment, I feel that the patent would benefit from supportive care in the home setting and symptom management using over-the-counter medications with close follow-up in primary care for evaluation of improvement of symptoms.  Return precautions given.                                   Medical Decision Making  This note was produced using [employer] voice recognition. While the provider has reviewed and verified all clinical information,  transcription errors may remain.    Final diagnoses:  Influenza A    ED Discharge Orders     None          [name] DELENA DEVONNA 01/26/25 2244    Doretha Folks, MD 01/26/25 2310  "

## 2025-01-26 NOTE — ED Triage Notes (Signed)
 Pt reports flu like S/S starting yesterday.

## 2025-01-26 NOTE — Discharge Instructions (Signed)
 It was a pleasure meeting with you today. You tested positive for influenza A.   Read the instructions below on reasons to return to the emergency department and to learn more about your diagnosis.  Followup with your primary care doctor in 4 days if your symptoms persist.  You're more than welcome to return to the emergency department if symptoms worsen or become concerning.  Upper Respiratory Infection, Adult  An upper respiratory infection (URI) is also sometimes known as the common cold. Most people improve within 1 week, but symptoms can last up to 2 weeks. A residual cough may last even longer.   URI is most commonly caused by a virus. Viruses are NOT treated with antibiotics. You can easily spread the virus to others by oral contact. This includes kissing, sharing a glass, coughing, or sneezing. Touching your mouth or nose and then touching a surface, which is then touched by another person, can also spread the virus.   TREATMENT  Treatment is directed at relieving symptoms. There is no cure. Antibiotics are not effective, because the infection is caused by a virus, not by bacteria. Treatment may include:  Increased fluid intake. Sports drinks offer valuable electrolytes, sugars, and fluids.  Breathing heated mist or steam (vaporizer or shower).  Eating chicken soup or other clear broths, and maintaining good nutrition.  Getting plenty of rest.  Using gargles or lozenges for comfort.  Controlling fevers with acetaminophen  as directed by your caregiver.  Increasing usage of your inhaler if you have asthma.  Return to work when your temperature has returned to normal.   SEEK MEDICAL CARE IF:  After the first few days, you feel you are getting worse rather than better.  You develop worsening shortness of breath, or brown or red sputum. These may be signs of pneumonia.  You develop yellow or brown nasal discharge or pain in the face, especially when you bend forward. These may be signs of  sinusitis.  You develop a fever, swollen neck glands, pain with swallowing, or white areas in the back of your throat. These may be signs of strep throat.
